# Patient Record
Sex: Female | Born: 1980 | Race: Black or African American | Hispanic: No | Marital: Married | Smoking: Current every day smoker
Health system: Southern US, Community
[De-identification: ages and names within clinical notes are randomized; demographics above are authoritative.]

## PROBLEM LIST (undated history)

## (undated) DIAGNOSIS — O24919 Unspecified diabetes mellitus in pregnancy, unspecified trimester: Secondary | ICD-10-CM

---
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
## 1999-02-04 ENCOUNTER — Inpatient Hospital Stay (HOSPITAL_COMMUNITY): Admission: AD | Admit: 1999-02-04 | Discharge: 1999-02-06 | Payer: Self-pay | Admitting: Obstetrics

## 1999-02-04 ENCOUNTER — Encounter (INDEPENDENT_AMBULATORY_CARE_PROVIDER_SITE_OTHER): Payer: Self-pay | Admitting: Specialist

## 1999-03-23 ENCOUNTER — Emergency Department (HOSPITAL_COMMUNITY): Admission: EM | Admit: 1999-03-23 | Discharge: 1999-03-23 | Payer: Self-pay | Admitting: Emergency Medicine

## 1999-03-25 ENCOUNTER — Emergency Department (HOSPITAL_COMMUNITY): Admission: EM | Admit: 1999-03-25 | Discharge: 1999-03-25 | Payer: Self-pay

## 2001-03-24 ENCOUNTER — Emergency Department (HOSPITAL_COMMUNITY): Admission: EM | Admit: 2001-03-24 | Discharge: 2001-03-24 | Payer: Self-pay | Admitting: Emergency Medicine

## 2001-08-29 ENCOUNTER — Emergency Department (HOSPITAL_COMMUNITY): Admission: EM | Admit: 2001-08-29 | Discharge: 2001-08-29 | Payer: Self-pay

## 2001-09-26 ENCOUNTER — Encounter: Payer: Self-pay | Admitting: Emergency Medicine

## 2001-09-26 ENCOUNTER — Emergency Department (HOSPITAL_COMMUNITY): Admission: EM | Admit: 2001-09-26 | Discharge: 2001-09-26 | Payer: Self-pay | Admitting: Emergency Medicine

## 2002-03-03 ENCOUNTER — Inpatient Hospital Stay (HOSPITAL_COMMUNITY): Admission: AD | Admit: 2002-03-03 | Discharge: 2002-03-03 | Payer: Self-pay | Admitting: Obstetrics and Gynecology

## 2003-08-15 ENCOUNTER — Inpatient Hospital Stay (HOSPITAL_COMMUNITY): Admission: AD | Admit: 2003-08-15 | Discharge: 2003-08-15 | Payer: Self-pay | Admitting: Obstetrics and Gynecology

## 2005-04-24 ENCOUNTER — Emergency Department (HOSPITAL_COMMUNITY): Admission: EM | Admit: 2005-04-24 | Discharge: 2005-04-24 | Payer: Self-pay | Admitting: Emergency Medicine

## 2009-02-14 ENCOUNTER — Ambulatory Visit (HOSPITAL_COMMUNITY): Admission: RE | Admit: 2009-02-14 | Discharge: 2009-02-14 | Payer: Self-pay | Admitting: Obstetrics & Gynecology

## 2009-03-06 ENCOUNTER — Encounter: Admission: RE | Admit: 2009-03-06 | Discharge: 2009-03-06 | Payer: Self-pay | Admitting: Gastroenterology

## 2010-05-12 ENCOUNTER — Ambulatory Visit (HOSPITAL_COMMUNITY): Admission: RE | Admit: 2010-05-12 | Discharge: 2010-05-12 | Payer: Self-pay | Admitting: Obstetrics & Gynecology

## 2010-06-09 ENCOUNTER — Inpatient Hospital Stay (HOSPITAL_COMMUNITY): Admission: AD | Admit: 2010-06-09 | Discharge: 2010-06-12 | Payer: Self-pay | Admitting: Obstetrics

## 2010-06-09 ENCOUNTER — Encounter: Payer: Self-pay | Admitting: Obstetrics & Gynecology

## 2010-06-11 ENCOUNTER — Ambulatory Visit (HOSPITAL_COMMUNITY): Admission: RE | Admit: 2010-06-11 | Discharge: 2010-06-11 | Payer: Self-pay | Admitting: Obstetrics

## 2010-08-02 ENCOUNTER — Encounter: Payer: Self-pay | Admitting: Obstetrics & Gynecology

## 2010-08-07 ENCOUNTER — Other Ambulatory Visit: Payer: Self-pay | Admitting: Obstetrics & Gynecology

## 2010-08-11 ENCOUNTER — Ambulatory Visit (HOSPITAL_COMMUNITY)
Admission: RE | Admit: 2010-08-11 | Discharge: 2010-08-11 | Payer: Self-pay | Source: Home / Self Care | Attending: Obstetrics | Admitting: Obstetrics

## 2010-08-11 ENCOUNTER — Other Ambulatory Visit (HOSPITAL_COMMUNITY): Payer: Self-pay | Admitting: Maternal and Fetal Medicine

## 2010-08-11 DIAGNOSIS — O24419 Gestational diabetes mellitus in pregnancy, unspecified control: Secondary | ICD-10-CM

## 2010-08-13 ENCOUNTER — Encounter: Payer: Self-pay | Admitting: Obstetrics

## 2010-08-14 ENCOUNTER — Ambulatory Visit (HOSPITAL_COMMUNITY): Payer: Self-pay

## 2010-08-14 ENCOUNTER — Ambulatory Visit (HOSPITAL_COMMUNITY)
Admission: RE | Admit: 2010-08-14 | Discharge: 2010-08-14 | Disposition: A | Discharge status: Home / Self Care | Payer: Medicaid Other | Source: Ambulatory Visit | Attending: Maternal and Fetal Medicine | Admitting: Maternal and Fetal Medicine

## 2010-08-14 ENCOUNTER — Encounter (HOSPITAL_COMMUNITY): Payer: Self-pay

## 2010-08-14 ENCOUNTER — Other Ambulatory Visit (HOSPITAL_COMMUNITY): Payer: Self-pay | Admitting: Maternal and Fetal Medicine

## 2010-08-14 DIAGNOSIS — O99810 Abnormal glucose complicating pregnancy: Secondary | ICD-10-CM | POA: Insufficient documentation

## 2010-08-14 DIAGNOSIS — O2690 Pregnancy related conditions, unspecified, unspecified trimester: Secondary | ICD-10-CM

## 2010-08-14 DIAGNOSIS — O24419 Gestational diabetes mellitus in pregnancy, unspecified control: Secondary | ICD-10-CM

## 2010-08-14 DIAGNOSIS — O26879 Cervical shortening, unspecified trimester: Secondary | ICD-10-CM | POA: Insufficient documentation

## 2010-08-14 DIAGNOSIS — Z8751 Personal history of pre-term labor: Secondary | ICD-10-CM | POA: Insufficient documentation

## 2010-08-14 HISTORY — DX: Unspecified diabetes mellitus in pregnancy, unspecified trimester: O24.919

## 2010-08-18 ENCOUNTER — Inpatient Hospital Stay (HOSPITAL_COMMUNITY)
Admission: AD | Admit: 2010-08-18 | Discharge: 2010-08-23 | DRG: 774 | Disposition: A | Discharge status: Home Health | Payer: Medicaid Other | Source: Ambulatory Visit | Attending: Obstetrics & Gynecology | Admitting: Obstetrics & Gynecology

## 2010-08-18 DIAGNOSIS — O2432 Unspecified pre-existing diabetes mellitus in childbirth: Secondary | ICD-10-CM | POA: Diagnosis present

## 2010-08-18 DIAGNOSIS — O328XX0 Maternal care for other malpresentation of fetus, not applicable or unspecified: Secondary | ICD-10-CM | POA: Diagnosis present

## 2010-08-18 DIAGNOSIS — O99892 Other specified diseases and conditions complicating childbirth: Secondary | ICD-10-CM | POA: Diagnosis present

## 2010-08-18 DIAGNOSIS — IMO0002 Reserved for concepts with insufficient information to code with codable children: Principal | ICD-10-CM | POA: Diagnosis present

## 2010-08-18 DIAGNOSIS — Z22330 Carrier of Group B streptococcus: Secondary | ICD-10-CM

## 2010-08-18 DIAGNOSIS — E119 Type 2 diabetes mellitus without complications: Secondary | ICD-10-CM | POA: Diagnosis present

## 2010-08-18 LAB — URINALYSIS, DIPSTICK ONLY
Specific Gravity, Urine: >1.03 — ABNORMAL HIGH (ref 1.005–1.030)
Urine Glucose, Fasting: NEGATIVE mg/dL
Urobilinogen, UA: 0.2 mg/dL (ref 0.0–1.0)
pH: 6 (ref 5.0–8.0)

## 2010-08-18 LAB — COMPREHENSIVE METABOLIC PANEL
Alkaline Phosphatase: 130 U/L — ABNORMAL HIGH (ref 39–117)
BUN: 7 mg/dL (ref 6–23)
Chloride: 109 mEq/L (ref 96–112)
GFR calc non Af Amer: >60 mL/min (ref >60)
Glucose, Bld: 80 mg/dL (ref 70–99)
Potassium: 4.3 mEq/L (ref 3.5–5.1)
Total Bilirubin: 0.5 mg/dL (ref 0.3–1.2)

## 2010-08-18 LAB — CBC
HCT: 34.5 % — ABNORMAL LOW (ref 36.0–46.0)
MCH: 28.9 pg (ref 26.0–34.0)
MCV: 86.7 fL (ref 78.0–100.0)
RBC: 3.98 MIL/uL (ref 3.87–5.11)
WBC: 8.5 10*3/uL (ref 4.0–10.5)

## 2010-08-18 LAB — URIC ACID: Uric Acid, Serum: 5.9 mg/dL (ref 2.4–7.0)

## 2010-08-18 LAB — GLUCOSE, CAPILLARY: Glucose-Capillary: 121 mg/dL — ABNORMAL HIGH (ref 70–99)

## 2010-08-18 LAB — LACTATE DEHYDROGENASE: LDH: 177 U/L (ref 94–250)

## 2010-08-19 LAB — PROTEIN, URINE, 24 HOUR
Protein, 24H Urine: 410 mg/d — ABNORMAL HIGH (ref 50–100)
Urine Total Volume-UPROT: 2925 mL

## 2010-08-20 ENCOUNTER — Ambulatory Visit (HOSPITAL_COMMUNITY): Payer: Medicaid Other

## 2010-08-20 LAB — CBC
HCT: 32.4 % — ABNORMAL LOW (ref 36.0–46.0)
Hemoglobin: 10.9 g/dL — ABNORMAL LOW (ref 12.0–15.0)
MCHC: 33.6 g/dL (ref 30.0–36.0)
Platelets: 243 10*3/uL (ref 150–400)
RDW: 14 % (ref 11.5–15.5)
WBC: 10.5 10*3/uL (ref 4.0–10.5)

## 2010-08-20 LAB — COMPREHENSIVE METABOLIC PANEL
ALT: 17 U/L (ref 0–35)
AST: 27 U/L (ref 0–37)
Albumin: 2.6 g/dL — ABNORMAL LOW (ref 3.5–5.2)
Alkaline Phosphatase: 126 U/L — ABNORMAL HIGH (ref 39–117)
BUN: 6 mg/dL (ref 6–23)
CO2: 19 mEq/L (ref 19–32)
Calcium: 8.1 mg/dL — ABNORMAL LOW (ref 8.4–10.5)
Chloride: 110 mEq/L (ref 96–112)
Creatinine, Ser: 0.7 mg/dL (ref 0.4–1.2)
GFR calc Af Amer: >60 mL/min (ref >60)
GFR calc non Af Amer: >60 mL/min (ref >60)
Glucose, Bld: 103 mg/dL — ABNORMAL HIGH (ref 70–99)
Potassium: 3.8 mEq/L (ref 3.5–5.1)
Sodium: 136 mEq/L (ref 135–145)
Total Bilirubin: 0.4 mg/dL (ref 0.3–1.2)
Total Protein: 5.5 g/dL — ABNORMAL LOW (ref 6.0–8.3)

## 2010-08-20 LAB — GLUCOSE, CAPILLARY
Glucose-Capillary: 131 mg/dL — ABNORMAL HIGH (ref 70–99)
Glucose-Capillary: 149 mg/dL — ABNORMAL HIGH (ref 70–99)
Glucose-Capillary: 99 mg/dL (ref 70–99)

## 2010-08-21 ENCOUNTER — Other Ambulatory Visit: Payer: Self-pay | Admitting: Obstetrics & Gynecology

## 2010-08-21 LAB — GLUCOSE, CAPILLARY
Glucose-Capillary: 96 mg/dL (ref 70–99)
Glucose-Capillary: 105 mg/dL — ABNORMAL HIGH (ref 70–99)
Glucose-Capillary: 99 mg/dL (ref 70–99)
Glucose-Capillary: 105 mg/dL — ABNORMAL HIGH (ref 70–99)
Glucose-Capillary: 117 mg/dL — ABNORMAL HIGH (ref 70–99)

## 2010-08-21 LAB — MRSA PCR SCREENING: MRSA by PCR: NEGATIVE

## 2010-08-22 LAB — COMPREHENSIVE METABOLIC PANEL
AST: 19 U/L (ref 0–37)
Albumin: 2 g/dL — ABNORMAL LOW (ref 3.5–5.2)
BUN: 10 mg/dL (ref 6–23)
Calcium: 7.6 mg/dL — ABNORMAL LOW (ref 8.4–10.5)
Creatinine, Ser: 0.99 mg/dL (ref 0.4–1.2)
GFR calc Af Amer: >60 mL/min (ref >60)
GFR calc non Af Amer: >60 mL/min (ref >60)

## 2010-08-22 LAB — T4, FREE: Free T4: 0.75 ng/dL — ABNORMAL LOW (ref 0.80–1.80)

## 2010-08-22 LAB — CARDIAC PANEL(CRET KIN+CKTOT+MB+TROPI)
CK, MB: 1.4 ng/mL (ref 0.3–4.0)
Total CK: 106 U/L (ref 7–177)
Troponin I: 0.02 ng/mL (ref 0.00–0.06)

## 2010-08-22 LAB — CBC
MCH: 29.2 pg (ref 26.0–34.0)
MCHC: 33.5 g/dL (ref 30.0–36.0)
Platelets: 200 10*3/uL (ref 150–400)

## 2010-08-24 NOTE — H&P (Signed)
Brenda Garcia, Brenda Garcia                ACCOUNT NO.:  [id number]  MEDICAL RECORD NO.:  [id number]           PATIENT TYPE:  I  LOCATION:  9174                          FACILITY:  [facility]  PHYSICIAN:  Roseanna Rainbow, M.D.DATE OF BIRTH:  03-Aug-1980  DATE OF ADMISSION:  08/18/2010 DATE OF DISCHARGE:                             HISTORY & PHYSICAL   CHIEF COMPLAINT:  The patient is a 30 year old para 1 with an estimated date of confinement by ultrasound of September 28, 2010 with an intrauterine pregnancy at 34.1 weeks who presents for a routine prenatal visit.  HISTORY OF PRESENT ILLNESS:  The patient complains of right upper quadrant, epigastric pain.  She denies any associated nausea or vomiting.  She also denies any neurological symptoms.  The patient's CBG record was reviewed.  The fasting CBGs were within range.  However, there were some outliers--postprandial CBG's in the 180-200 range. These abnormal CBGs were random and there was no pattern to the CBG elevations.  The patient reports only taking the glyburide 5 mg twice a day in the morning and at bedtime.  She reports good fetal movement.  ALLERGIES:  No known drug allergies.  MEDICATIONS:  Glyburide 5 mg p.o. b.i.d.  OB RISK FACTORS:  History of a previous preterm delivery.  The patient has been receiving 17-P injections likely pregestational adult-onset diabetes type 2 undiagnosed.  Hemoglobin A1c on February 2 was 7. History of genital herpes, cervical insufficiency, GBS asymptomatic bacteriuria.  PAST OB HISTORY:  In July 2000, she was delivered at 27 weeks vaginal delivery 2 pounds 6 ounces female.  PRENATAL LABS:  Chlamydia negative.  Urine culture and sensitivity insignificant growth on February 2.  On September 20, GBS was isolated. Pap smear negative.  GC probe negative.  On a 2-hour GTT, all of the values were abnormal with fasting of 130, 1-hour 211, 2-hour 195. Hepatitis B surface antigen negative.  Hematocrit 33.9,  hemoglobin 11.8, HIV nonreactive, platelets 315,000.  Blood type is O positive, RPR nonreactive, rubella immune.  Sickle cell negative.  An ultrasound on November 30 was a limited study for cervical length at 24 weeks 3 days. The cervical length was dynamic ranging from 0.8-1.2 cm with funneling. An ultrasound on November 28, no previa, normal amniotic fluid index. The estimated fetal weight was at the 75th percentile for a 24 weeks 1 day gestation.  Cervical length was 1.2 cm with a U-shaped funnel. Prior pyelectasis was noted and resolved.  At ultrasound on October 31 at 20 weeks 1 day, normal cervical length, normal level to fetal anatomy, very mild unilateral pyelectasis, normal amniotic fluid volume. An early ultrasound at 14 weeks 5 days, sonographic EDC of March 18.  PAST GYN HISTORY:  Please see the above.  PAST MEDICAL HISTORY:  There is a vague history of a cardiac arrhythmia.  PAST SURGICAL HISTORY:  She denies.  SOCIAL HISTORY:  She is a Consulting civil engineer, is engaged, living with a significant other, not currently using alcohol, formally a moderate user, has no significant smoking history.  FAMILY HISTORY:  Noncontributory.  REVIEW OF SYSTEMS:  NEUROLOGIC:  Please see the  above.  GI: Please see the above.  PHYSICAL EXAMINATION:  VITAL SIGNS:  Blood pressure 149/81, oxygen saturation 98% on room air.  Urinalysis 2+ protein on urine dip. GENERAL:  Moderate distress. ABDOMEN:  Nontender, gravid.  Sterile vaginal exam.  The cervix is 2 cm dilated, 60% effaced.  An informal ultrasound confirms a cephalic presentation.  ASSESSMENT:  Intrauterine pregnancy at 34.1 weeks complicated by 1. Likely pre gestational adult-onset diabetes type 2, borderline     control. 2. Now with rule out severe preeclampsia with epigastric pain,     proteinuria. 3.  Positive GBS.  history of GBS asymptomatic bacteria.  PLAN:  Admission, PIH panel, monitor closely.  Magnesium sulfate for seizure  prophylaxis.  This patient was reviewed with Dr. Rica Koyanagi of Maternal Fetal Medicine.  Recommend delivery for trending upward blood pressures, worsening epigastric pain, or trending abnormal laboratory values.  Penicillin for GBS prophylaxis in the likelihood that the patient undergoes induction of labor.     Roseanna Rainbow, M.D.     Judee Clara  D:  08/18/2010  T:  08/18/2010  Job:  [id number]  Electronically Signed by Antionette Char M.D. on 08/21/2010 11:49:01 AM

## 2010-09-04 ENCOUNTER — Ambulatory Visit (HOSPITAL_COMMUNITY): Payer: Medicaid Other

## 2010-09-23 LAB — URINALYSIS, ROUTINE W REFLEX MICROSCOPIC
Nitrite: NEGATIVE
Specific Gravity, Urine: 1.025 (ref 1.005–1.030)
Urobilinogen, UA: 0.2 mg/dL (ref 0.0–1.0)
pH: 6.5 (ref 5.0–8.0)

## 2010-09-23 LAB — CBC
Hemoglobin: 11.3 g/dL — ABNORMAL LOW (ref 12.0–15.0)
MCH: 31.4 pg (ref 26.0–34.0)
MCHC: 34.1 g/dL (ref 30.0–36.0)
MCV: 92 fL (ref 78.0–100.0)

## 2010-09-23 LAB — COMPREHENSIVE METABOLIC PANEL
AST: 23 U/L (ref 0–37)
BUN: 4 mg/dL — ABNORMAL LOW (ref 6–23)
CO2: 20 mEq/L (ref 19–32)
Calcium: 9 mg/dL (ref 8.4–10.5)
Chloride: 108 mEq/L (ref 96–112)
Creatinine, Ser: 0.49 mg/dL (ref 0.4–1.2)
GFR calc Af Amer: >60 mL/min (ref >60)
GFR calc non Af Amer: >60 mL/min (ref >60)
Glucose, Bld: 120 mg/dL — ABNORMAL HIGH (ref 70–99)
Total Bilirubin: 0.1 mg/dL — ABNORMAL LOW (ref 0.3–1.2)

## 2010-09-28 ENCOUNTER — Inpatient Hospital Stay (HOSPITAL_COMMUNITY): Admission: AD | Admit: 2010-09-28 | Payer: Self-pay | Source: Home / Self Care | Admitting: Obstetrics & Gynecology

## 2010-10-07 ENCOUNTER — Ambulatory Visit (HOSPITAL_COMMUNITY)
Admission: RE | Admit: 2010-10-07 | Payer: Medicaid Other | Source: Ambulatory Visit | Admitting: Obstetrics & Gynecology

## 2011-04-28 ENCOUNTER — Encounter (HOSPITAL_COMMUNITY): Payer: Self-pay | Admitting: *Deleted

## 2011-06-20 ENCOUNTER — Emergency Department (HOSPITAL_COMMUNITY)
Admission: EM | Admit: 2011-06-20 | Discharge: 2011-06-20 | Discharge status: Absent without leave | Payer: Self-pay | Source: Home / Self Care

## 2012-02-24 ENCOUNTER — Ambulatory Visit (INDEPENDENT_AMBULATORY_CARE_PROVIDER_SITE_OTHER): Payer: BC Managed Care – PPO | Admitting: Family Medicine

## 2012-02-24 VITALS — BP 118/74 | HR 79 | Temp 98.5°F | Resp 18 | Ht 68.0 in | Wt 237.0 lb

## 2012-02-24 DIAGNOSIS — Z23 Encounter for immunization: Secondary | ICD-10-CM

## 2012-02-24 NOTE — Progress Notes (Signed)
[facility] [address], [place] [address] [phone]  Date:  02/24/2012   Name:  Brenda Garcia   DOB:  December 16, 1980   MRN:  [id number]  PCP:  Per Patient No Pcp    Chief Complaint: Immunizations   History of Present Illness:  Brenda Garcia is a 31 y.o. very pleasant female patient who presents with the following:  She needs a tdap to start at [employer]- she plans to study biology.  Her last tetanus was about 10 years ago.  She is generally healthy, no chance of pregnancy.  No other concerns today  There is no problem list on file for this patient.   Past Medical History  Diagnosis Date  . Diabetes in pregnancy     No past surgical history on file.  History  Substance Use Topics  . Smoking status: Never Smoker   . Smokeless tobacco: Not on file  . Alcohol Use: Not on file    No family history on file.  Allergies  Allergen Reactions  . Iohexol      Code: VOM, Desc: severe vomiting tightness in face swollen face and eyes  fluids run no meds given pt observed 30 min and released with reation card  ms 03/06/2009     Medication list has been reviewed and updated.  No current outpatient prescriptions on file prior to visit.    Review of Systems:  As per HPI- otherwise negative.   Physical Examination: Filed Vitals:   02/24/12 1607  BP: 118/74  Pulse: 79  Temp: 98.5 F (36.9 C)  Resp: 18   Filed Vitals:   02/24/12 1607  Height: 5\' 8"  (1.727 m)  Weight: 237 lb (107.502 kg)   Body mass index is 36.04 kg/(m^2). Ideal Body Weight: Weight in (lb) to have BMI = 25: 164.1   GEN: WDWN, NAD, Non-toxic, A & O x 3, obese HEENT: Atraumatic, Normocephalic. Neck supple. No masses, No LAD.  Oropharynx wnl, PEERL Ears and Nose: No external deformity. CV: RRR, No M/G/R. No JVD. No thrill. No extra heart sounds. PULM: CTA B, no wheezes, crackles, rhonchi. No retractions. No resp. distress. No accessory muscle use. EXTR: No c/c/e NEURO  Normal gait.  PSYCH: Normally interactive. Conversant. Not depressed or anxious appearing.  Calm demeanor.    Assessment and Plan: 1. Need for diphtheria-tetanus-pertussis (Tdap) vaccine, adult/adolescent  Tdap vaccine greater than or equal to 7yo IM   Update Tdap today.  Best of luck in your studies!   Abbe Amsterdam, MD

## 2012-04-20 ENCOUNTER — Inpatient Hospital Stay (HOSPITAL_COMMUNITY)
Admission: EM | Admit: 2012-04-20 | Discharge: 2012-04-22 | DRG: 494 | Disposition: A | Discharge status: Home / Self Care | Payer: BC Managed Care – PPO | Attending: General Surgery | Admitting: General Surgery

## 2012-04-20 ENCOUNTER — Encounter (HOSPITAL_COMMUNITY): Payer: Self-pay | Admitting: *Deleted

## 2012-04-20 ENCOUNTER — Emergency Department (HOSPITAL_COMMUNITY): Payer: BC Managed Care – PPO

## 2012-04-20 DIAGNOSIS — F17200 Nicotine dependence, unspecified, uncomplicated: Secondary | ICD-10-CM | POA: Diagnosis present

## 2012-04-20 DIAGNOSIS — K8010 Calculus of gallbladder with chronic cholecystitis without obstruction: Secondary | ICD-10-CM

## 2012-04-20 DIAGNOSIS — K8020 Calculus of gallbladder without cholecystitis without obstruction: Principal | ICD-10-CM | POA: Diagnosis present

## 2012-04-20 LAB — COMPREHENSIVE METABOLIC PANEL
ALT: 16 U/L (ref 0–35)
AST: 17 U/L (ref 0–37)
Albumin: 4.2 g/dL (ref 3.5–5.2)
Alkaline Phosphatase: 65 U/L (ref 39–117)
Calcium: 9.6 mg/dL (ref 8.4–10.5)
GFR calc Af Amer: >90 mL/min (ref >90)
Glucose, Bld: 176 mg/dL — ABNORMAL HIGH (ref 70–99)
Potassium: 4 mEq/L (ref 3.5–5.1)
Sodium: 138 mEq/L (ref 135–145)
Total Protein: 8 g/dL (ref 6.0–8.3)

## 2012-04-20 LAB — CBC WITH DIFFERENTIAL/PLATELET
Basophils Absolute: 0.1 10*3/uL (ref 0.0–0.1)
Eosinophils Absolute: 0 10*3/uL (ref 0.0–0.7)
Lymphs Abs: 2 10*3/uL (ref 0.7–4.0)
MCH: 30.3 pg (ref 26.0–34.0)
Neutrophils Relative %: 74 % (ref 43–77)
Platelets: 324 10*3/uL (ref 150–400)
RBC: 4.58 MIL/uL (ref 3.87–5.11)
RDW: 12.1 % (ref 11.5–15.5)
WBC: 9.8 10*3/uL (ref 4.0–10.5)

## 2012-04-20 LAB — URINE MICROSCOPIC-ADD ON

## 2012-04-20 LAB — PREGNANCY, URINE: Preg Test, Ur: NEGATIVE

## 2012-04-20 LAB — URINALYSIS, ROUTINE W REFLEX MICROSCOPIC
Hgb urine dipstick: NEGATIVE
Nitrite: NEGATIVE
Protein, ur: 30 mg/dL — AB
Specific Gravity, Urine: 1.034 — ABNORMAL HIGH (ref 1.005–1.030)
Urobilinogen, UA: 0.2 mg/dL (ref 0.0–1.0)

## 2012-04-20 MED ORDER — SODIUM CHLORIDE 0.9 % IV BOLUS (SEPSIS)
1000.0000 mL | Freq: Once | INTRAVENOUS | Status: AC
  Administered 2012-04-20: 1000 mL via INTRAVENOUS

## 2012-04-20 MED ORDER — DIPHENHYDRAMINE HCL 50 MG/ML IJ SOLN: 12.5000 mg | Freq: Four times a day (QID) | INTRAMUSCULAR | Status: DC | PRN

## 2012-04-20 MED ORDER — KCL IN DEXTROSE-NACL 20-5-0.45 MEQ/L-%-% IV SOLN
INTRAVENOUS | Status: DC
  Administered 2012-04-20: 16:00:00 via INTRAVENOUS
  Administered 2012-04-21: 100 mL via INTRAVENOUS
  Filled 2012-04-20 (×4): qty 1000

## 2012-04-20 MED ORDER — HYDROMORPHONE HCL PF 1 MG/ML IJ SOLN
0.5000 mg | Freq: Once | INTRAMUSCULAR | Status: AC
  Administered 2012-04-20: 0.5 mg via INTRAVENOUS
  Filled 2012-04-20: qty 1

## 2012-04-20 MED ORDER — DIPHENHYDRAMINE HCL 12.5 MG/5ML PO ELIX
12.5000 mg | ORAL_SOLUTION | Freq: Four times a day (QID) | ORAL | Status: DC | PRN
  Filled 2012-04-20: qty 10

## 2012-04-20 MED ORDER — ONDANSETRON HCL 4 MG/2ML IJ SOLN
4.0000 mg | Freq: Once | INTRAMUSCULAR | Status: AC
  Administered 2012-04-20: 4 mg via INTRAVENOUS
  Filled 2012-04-20: qty 2

## 2012-04-20 MED ORDER — ONDANSETRON HCL 4 MG/2ML IJ SOLN: 4.0000 mg | Freq: Four times a day (QID) | INTRAMUSCULAR | Status: DC | PRN

## 2012-04-20 MED ORDER — HYDROMORPHONE HCL PF 1 MG/ML IJ SOLN
0.5000 mg | INTRAMUSCULAR | Status: DC | PRN
  Administered 2012-04-20 – 2012-04-21 (×5): 1 mg via INTRAVENOUS
  Filled 2012-04-20 (×5): qty 1

## 2012-04-20 MED ORDER — HYDROMORPHONE HCL PF 1 MG/ML IJ SOLN
1.0000 mg | Freq: Once | INTRAMUSCULAR | Status: AC
  Administered 2012-04-20: 1 mg via INTRAVENOUS
  Filled 2012-04-20: qty 1

## 2012-04-20 NOTE — ED Notes (Signed)
Patient transported to Ultrasound 

## 2012-04-20 NOTE — ED Notes (Signed)
C/o RUQ pain, n/v onset this morning. Denies diarrhea

## 2012-04-20 NOTE — ED Notes (Signed)
C/o intermittent RUQ pain x 3-4 yrs. Reports pain seems to be worse after eating. This episode started this morning. +n/v, denies diarrhea, fever, urinary frequency, dysuria. Stated, "my stomach has been acting up & beginning to hurt the past month".

## 2012-04-20 NOTE — ED Notes (Addendum)
Patient transported to Ultrasound 

## 2012-04-20 NOTE — H&P (Signed)
Brenda Garcia is an 31 y.o. female.   Chief Complaint: Abdominal pain, nausea and vomiting HPI: 31 yr old female who presented to [facility] with 24 hour history of abd pain, nausea and vomiting.  She reports having similar symptoms for 2 years but none as severe and this is the first time she has had vomiting.  Her previous symptoms would last about 2-3 days then improve.  She could usually still eat with these symptoms.  She is having pain in the RUQ.  She denies fever, chills, diarrhea, constipation, vomiting blood or blood in stool.  Her pain and nausea is not getting any better.  She has not had any abdominal surgeries in the past.  History reviewed. No pertinent past medical history.  History reviewed. No pertinent past surgical history.  No family history on file. Social History:  reports that she has been smoking.  She does not have any smokeless tobacco history on file. She reports that she drinks alcohol. She reports that she does not use illicit drugs.  Allergies:  Allergies  Allergen Reactions  . Iohexol      Code: VOM, Desc: severe vomiting tightness in face swollen face and eyes  fluids run no meds given pt observed 30 min and released with reation card  ms 03/06/2009      (Not in a hospital admission)  Results for orders placed during the hospital encounter of 04/20/12 (from the past 48 hour(s))  URINALYSIS, ROUTINE W REFLEX MICROSCOPIC     Status: Abnormal   Collection Time   04/20/12 10:02 AM      Component Value Range Comment   Color, Urine YELLOW  YELLOW    APPearance CLEAR  CLEAR    Specific Gravity, Urine 1.034 (*) 1.005 - 1.030    pH 8.0  5.0 - 8.0    Glucose, UA NEGATIVE  NEGATIVE mg/dL    Hgb urine dipstick NEGATIVE  NEGATIVE    Bilirubin Urine NEGATIVE  NEGATIVE    Ketones, ur NEGATIVE  NEGATIVE mg/dL    Protein, ur 30 (*) NEGATIVE mg/dL    Urobilinogen, UA 0.2  0.0 - 1.0 mg/dL    Nitrite NEGATIVE  NEGATIVE    Leukocytes, UA TRACE (*) NEGATIVE   PREGNANCY,  URINE     Status: Normal   Collection Time   04/20/12 10:02 AM      Component Value Range Comment   Preg Test, Ur NEGATIVE  NEGATIVE   URINE MICROSCOPIC-ADD ON     Status: Normal   Collection Time   04/20/12 10:02 AM      Component Value Range Comment   Squamous Epithelial / LPF RARE  RARE    WBC, UA 0-2  <3 WBC/hpf    RBC / HPF 0-2  <3 RBC/hpf    Bacteria, UA RARE  RARE   CBC WITH DIFFERENTIAL     Status: Normal   Collection Time   04/20/12 10:30 AM      Component Value Range Comment   WBC 9.8  4.0 - 10.5 K/uL    RBC 4.58  3.87 - 5.11 MIL/uL    Hemoglobin 13.9  12.0 - 15.0 g/dL    HCT 47.8  29.5 - 62.1 %    MCV 87.3  78.0 - 100.0 fL    MCH 30.3  26.0 - 34.0 pg    MCHC 34.8  30.0 - 36.0 g/dL    RDW 30.8  65.7 - 84.6 %    Platelets 324  150 -  400 K/uL    Neutrophils Relative 74  43 - 77 %    Neutro Abs 7.3  1.7 - 7.7 K/uL    Lymphocytes Relative 20  12 - 46 %    Lymphs Abs 2.0  0.7 - 4.0 K/uL    Monocytes Relative 5  3 - 12 %    Monocytes Absolute 0.5  0.1 - 1.0 K/uL    Eosinophils Relative 0  0 - 5 %    Eosinophils Absolute 0.0  0.0 - 0.7 K/uL    Basophils Relative 1  0 - 1 %    Basophils Absolute 0.1  0.0 - 0.1 K/uL   COMPREHENSIVE METABOLIC PANEL     Status: Abnormal   Collection Time   04/20/12 10:30 AM      Component Value Range Comment   Sodium 138  135 - 145 mEq/L    Potassium 4.0  3.5 - 5.1 mEq/L    Chloride 104  96 - 112 mEq/L    CO2 23  19 - 32 mEq/L    Glucose, Bld 176 (*) 70 - 99 mg/dL    BUN 8  6 - 23 mg/dL    Creatinine, Ser 4.09  0.50 - 1.10 mg/dL    Calcium 9.6  8.4 - 81.1 mg/dL    Total Protein 8.0  6.0 - 8.3 g/dL    Albumin 4.2  3.5 - 5.2 g/dL    AST 17  0 - 37 U/L    ALT 16  0 - 35 U/L    Alkaline Phosphatase 65  39 - 117 U/L    Total Bilirubin 0.5  0.3 - 1.2 mg/dL    GFR calc non Af Amer >90  >90 mL/min    GFR calc Af Amer >90  >90 mL/min   LIPASE, BLOOD     Status: Normal   Collection Time   04/20/12 10:30 AM      Component Value Range Comment    Lipase 18  11 - 59 U/L    US Abdomen Complete  04/20/2012  *RADIOLOGY REPORT*  Clinical Data:  Abdominal pain with nausea and vomiting.  COMPLETE ABDOMINAL ULTRASOUND  Comparison:  CT abdomen pelvis 03/06/2009.  Findings:  Gallbladder:  Shadowing echogenic stones measure up to 1.8 cm, located in the neck of the gallbladder.  Nonshadowing echogenic sludge is seen as well.  Gallbladder wall measures 2 mm. Sonographic Murphy's sign is reportedly present.  Common bile duct:  Measures 5 mm, within normal limits.  Liver:  No focal lesion identified.  Within normal limits in parenchymal echogenicity.  IVC:  Appears normal.  Pancreas:  No focal abnormality seen.  Spleen:  Measures 8.4 cm, negative.  Right Kidney:  Measures 14.0 cm.  Parenchymal echogenicity is normal.  No hydronephrosis.  No focal lesions.  Left Kidney:  Measures 15.1 cm.  Parenchymal echogenicity is normal.  No hydronephrosis.  No focal lesions.  Abdominal aorta:  No aneurysm identified.  IMPRESSION: Gallstones and sludge with a stone in the gallbladder neck.  No associated wall thickening or pericholecystic fluid.  Positive sonographic Murphy's sign.  Please correlate clinically for acute cholecystitis.   Original Report Authenticated By: Reyes Ivan, M.D.     Review of Systems  Constitutional: Negative.   HENT: Negative.   Eyes: Negative.   Respiratory: Negative.   Cardiovascular: Negative.   Gastrointestinal: Positive for nausea, vomiting and abdominal pain.  Genitourinary: Negative.   Musculoskeletal: Negative.   Skin: Negative.   Neurological: Negative.  Endo/Heme/Allergies: Negative.   Psychiatric/Behavioral: Negative.     Blood pressure 119/59, pulse 54, temperature 98.4 F (36.9 C), temperature source Oral, resp. rate 13, height 5\' 9"  (1.753 m), weight 220 lb (99.791 kg), SpO2 99.00%. Physical Exam  Constitutional: She is oriented to person, place, and time. She appears well-developed and well-nourished. No  distress.  HENT:  Head: Normocephalic and atraumatic.  Eyes: Conjunctivae normal are normal. Pupils are equal, round, and reactive to light.  Neck: Normal range of motion. Neck supple.  Cardiovascular: Normal rate and regular rhythm.   Respiratory: Effort normal and breath sounds normal.  GI: Soft. Bowel sounds are normal. She exhibits no distension. There is tenderness. There is no guarding.  Genitourinary:       Deferred   Musculoskeletal: Normal range of motion.  Neurological: She is alert and oriented to person, place, and time.  Skin: Skin is warm and dry.  Psychiatric: She has a normal mood and affect. Her behavior is normal.     Assessment/Plan 1.  Bilary colic/cholelithiasis/gallstone in neck of gallbladder: the patient sounds as if she as had recurring bilary colic over the last 2 years, the patient's current symptoms are not improving therefore we will admit.  She does not appear to have a CBD obstruction.  We will place her on IV fluids, pain meds and antiemetics.  She is agreeable to laparoscopic cholecystectomy since she is having long term problems.  Dr Maisie Fus will see and evaluate the patient later today.  Silverio Hagan 04/20/2012, 2:29 PM

## 2012-04-20 NOTE — ED Provider Notes (Signed)
Medical screening examination/treatment/procedure(s) were performed by non-physician practitioner and as supervising physician I was immediately available for consultation/collaboration.  Flint Melter, MD 04/20/12 2005

## 2012-04-20 NOTE — ED Provider Notes (Signed)
Brenda Garcia is a 31 y.o. female who is here for evaluation of recurrent right upper abdominal pain. In the ED, she has been medicated twice for pain, and continues to have 8/10, right upper quadrant, pain. The pain is constant and unremitting. Evaluation is consistent with acute gallbladder disease, without cholecystitis. I have discussed case with the general surgery service and they will see and evaluate the patient. In the emergency department.   Medical screening examination/treatment/procedure(s) were conducted as a shared visit with non-physician practitioner(s) and myself.  I personally evaluated the patient during the encounter  Flint Melter, MD 04/20/12 2003

## 2012-04-20 NOTE — ED Provider Notes (Signed)
History     CSN: [id number]  Arrival date & time 04/20/12  9147   First MD Initiated Contact with Patient 04/20/12 1009      Chief Complaint  Patient presents with  . Abdominal Pain    (Consider location/radiation/quality/duration/timing/severity/associated sxs/prior treatment) Patient is a 31 y.o. female presenting with abdominal pain. The history is provided by the patient. No language interpreter was used.  Abdominal Pain The primary symptoms of the illness include abdominal pain, nausea and vomiting. The current episode started 3 to 5 hours ago. The onset of the illness was sudden. The problem has not changed since onset. The illness is associated with eating. The patient states that she believes she is currently not pregnant. The patient has not had a change in bowel habit. Symptoms associated with the illness do not include chills, anorexia, diaphoresis, heartburn, constipation, urgency, hematuria, frequency or back pain. Significant associated medical issues do not include PUD, GERD, inflammatory bowel disease, diabetes, sickle cell disease, gallstones, liver disease, substance abuse, diverticulitis, HIV or cardiac disease.    History reviewed. No pertinent past medical history.  History reviewed. No pertinent past surgical history.  No family history on file.  History  Substance Use Topics  . Smoking status: Current Every Day Smoker  . Smokeless tobacco: Not on file  . Alcohol Use: Yes    OB History    Grav Para Term Preterm Abortions TAB SAB Ect Mult Living   1 1  1             Review of Systems  Constitutional: Negative for chills and diaphoresis.  Gastrointestinal: Positive for nausea, vomiting and abdominal pain. Negative for heartburn, constipation and anorexia.  Genitourinary: Negative for urgency, frequency and hematuria.  Musculoskeletal: Negative for back pain.  All other systems reviewed and are negative.    Allergies  Iohexol  Home Medications    Current Outpatient Rx  Name Route Sig Dispense Refill  . MOTRIN PM PO Oral Take 1 tablet by mouth at bedtime as needed. For sleep      BP 107/39  Pulse 56  Temp 98.4 F (36.9 C) (Oral)  Resp 17  Ht 5\' 9"  (1.753 m)  Wt 220 lb (99.791 kg)  BMI 32.49 kg/m2  SpO2 100%  Physical Exam  Nursing note and vitals reviewed. Constitutional: She is oriented to person, place, and time. She appears well-developed and well-nourished.  HENT:  Head: Normocephalic and atraumatic.  Eyes: Conjunctivae normal and EOM are normal. Pupils are equal, round, and reactive to light.  Neck: Normal range of motion. Neck supple.  Cardiovascular: Normal rate, regular rhythm and normal heart sounds.   Pulmonary/Chest: Effort normal and breath sounds normal.  Abdominal: Soft. Bowel sounds are normal. She exhibits no distension and no mass. There is tenderness. There is no rebound and no guarding.       Severe RUQ tenderness to palpation  Musculoskeletal: Normal range of motion.  Neurological: She is alert and oriented to person, place, and time.  Skin: Skin is warm and dry.  Psychiatric: She has a normal mood and affect. Her behavior is normal. Judgment and thought content normal.    ED Course  Procedures (including critical care time)  Labs Reviewed  URINALYSIS, ROUTINE W REFLEX MICROSCOPIC - Abnormal; Notable for the following:    Specific Gravity, Urine 1.034 (*)     Protein, ur 30 (*)     Leukocytes, UA TRACE (*)     All other components within normal limits  COMPREHENSIVE METABOLIC PANEL - Abnormal; Notable for the following:    Glucose, Bld 176 (*)     All other components within normal limits  PREGNANCY, URINE  CBC WITH DIFFERENTIAL  LIPASE, BLOOD  URINE MICROSCOPIC-ADD ON   Results for orders placed during the hospital encounter of 04/20/12  URINALYSIS, ROUTINE W REFLEX MICROSCOPIC      Component Value Range   Color, Urine YELLOW  YELLOW   APPearance CLEAR  CLEAR   Specific Gravity,  Urine 1.034 (*) 1.005 - 1.030   pH 8.0  5.0 - 8.0   Glucose, UA NEGATIVE  NEGATIVE mg/dL   Hgb urine dipstick NEGATIVE  NEGATIVE   Bilirubin Urine NEGATIVE  NEGATIVE   Ketones, ur NEGATIVE  NEGATIVE mg/dL   Protein, ur 30 (*) NEGATIVE mg/dL   Urobilinogen, UA 0.2  0.0 - 1.0 mg/dL   Nitrite NEGATIVE  NEGATIVE   Leukocytes, UA TRACE (*) NEGATIVE  PREGNANCY, URINE      Component Value Range   Preg Test, Ur NEGATIVE  NEGATIVE  CBC WITH DIFFERENTIAL      Component Value Range   WBC 9.8  4.0 - 10.5 K/uL   RBC 4.58  3.87 - 5.11 MIL/uL   Hemoglobin 13.9  12.0 - 15.0 g/dL   HCT 16.1  09.6 - 04.5 %   MCV 87.3  78.0 - 100.0 fL   MCH 30.3  26.0 - 34.0 pg   MCHC 34.8  30.0 - 36.0 g/dL   RDW 40.9  81.1 - 91.4 %   Platelets 324  150 - 400 K/uL   Neutrophils Relative 74  43 - 77 %   Neutro Abs 7.3  1.7 - 7.7 K/uL   Lymphocytes Relative 20  12 - 46 %   Lymphs Abs 2.0  0.7 - 4.0 K/uL   Monocytes Relative 5  3 - 12 %   Monocytes Absolute 0.5  0.1 - 1.0 K/uL   Eosinophils Relative 0  0 - 5 %   Eosinophils Absolute 0.0  0.0 - 0.7 K/uL   Basophils Relative 1  0 - 1 %   Basophils Absolute 0.1  0.0 - 0.1 K/uL  COMPREHENSIVE METABOLIC PANEL      Component Value Range   Sodium 138  135 - 145 mEq/L   Potassium 4.0  3.5 - 5.1 mEq/L   Chloride 104  96 - 112 mEq/L   CO2 23  19 - 32 mEq/L   Glucose, Bld 176 (*) 70 - 99 mg/dL   BUN 8  6 - 23 mg/dL   Creatinine, Ser 7.82  0.50 - 1.10 mg/dL   Calcium 9.6  8.4 - 95.6 mg/dL   Total Protein 8.0  6.0 - 8.3 g/dL   Albumin 4.2  3.5 - 5.2 g/dL   AST 17  0 - 37 U/L   ALT 16  0 - 35 U/L   Alkaline Phosphatase 65  39 - 117 U/L   Total Bilirubin 0.5  0.3 - 1.2 mg/dL   GFR calc non Af Amer >90  >90 mL/min   GFR calc Af Amer >90  >90 mL/min  LIPASE, BLOOD      Component Value Range   Lipase 18  11 - 59 U/L  URINE MICROSCOPIC-ADD ON      Component Value Range   Squamous Epithelial / LPF RARE  RARE   WBC, UA 0-2  <3 WBC/hpf   RBC / HPF 0-2  <3 RBC/hpf    Bacteria, UA RARE  RARE   US Abdomen Complete  04/20/2012  *RADIOLOGY REPORT*  Clinical Data:  Abdominal pain with nausea and vomiting.  COMPLETE ABDOMINAL ULTRASOUND  Comparison:  CT abdomen pelvis 03/06/2009.  Findings:  Gallbladder:  Shadowing echogenic stones measure up to 1.8 cm, located in the neck of the gallbladder.  Nonshadowing echogenic sludge is seen as well.  Gallbladder wall measures 2 mm. Sonographic Murphy's sign is reportedly present.  Common bile duct:  Measures 5 mm, within normal limits.  Liver:  No focal lesion identified.  Within normal limits in parenchymal echogenicity.  IVC:  Appears normal.  Pancreas:  No focal abnormality seen.  Spleen:  Measures 8.4 cm, negative.  Right Kidney:  Measures 14.0 cm.  Parenchymal echogenicity is normal.  No hydronephrosis.  No focal lesions.  Left Kidney:  Measures 15.1 cm.  Parenchymal echogenicity is normal.  No hydronephrosis.  No focal lesions.  Abdominal aorta:  No aneurysm identified.  IMPRESSION: Gallstones and sludge with a stone in the gallbladder neck.  No associated wall thickening or pericholecystic fluid.  Positive sonographic Murphy's sign.  Please correlate clinically for acute cholecystitis.   Original Report Authenticated By: Reyes Ivan, M.D.       No diagnosis found.    MDM   31 year old female with abdominal pain.  Suspect gallbladder disease.  Abdominal US results detailed above.  This patient has been seen by and discussed with Dr. Effie Shy.  Dr. Effie Shy has consulted surgery.  Disposition: Admit.       Roxy Horseman, PA-C 04/20/12 1436

## 2012-04-20 NOTE — H&P (Signed)
ATTENDING ADDENDUM:  I personally reviewed patient's record, examined the patient, and formulated the following plan:  Agree with PA note.  OR tom.  NPO p MN.

## 2012-04-21 ENCOUNTER — Encounter (HOSPITAL_COMMUNITY): Admission: EM | Disposition: A | Discharge status: Home / Self Care | Payer: Self-pay | Source: Home / Self Care

## 2012-04-21 ENCOUNTER — Encounter (HOSPITAL_COMMUNITY): Payer: Self-pay | Admitting: Anesthesiology

## 2012-04-21 ENCOUNTER — Inpatient Hospital Stay (HOSPITAL_COMMUNITY): Payer: BC Managed Care – PPO | Admitting: Anesthesiology

## 2012-04-21 HISTORY — PX: CHOLECYSTECTOMY: SHX55

## 2012-04-21 LAB — HEPATIC FUNCTION PANEL
ALT: 12 U/L (ref 0–35)
AST: 15 U/L (ref 0–37)
Albumin: 3.2 g/dL — ABNORMAL LOW (ref 3.5–5.2)
Alkaline Phosphatase: 59 U/L (ref 39–117)
Bilirubin, Direct: 0.1 mg/dL (ref 0.0–0.3)
Indirect Bilirubin: 0.9 mg/dL (ref 0.3–0.9)
Total Bilirubin: 1 mg/dL (ref 0.3–1.2)
Total Protein: 6.3 g/dL (ref 6.0–8.3)

## 2012-04-21 LAB — SURGICAL PCR SCREEN
MRSA, PCR: NEGATIVE
Staphylococcus aureus: NEGATIVE

## 2012-04-21 SURGERY — LAPAROSCOPIC CHOLECYSTECTOMY
Anesthesia: General | Site: Abdomen | Wound class: Clean Contaminated

## 2012-04-21 MED ORDER — PROPOFOL 10 MG/ML IV BOLUS
INTRAVENOUS | Status: DC | PRN
  Administered 2012-04-21: 150 mg via INTRAVENOUS

## 2012-04-21 MED ORDER — LIDOCAINE HCL (CARDIAC) 20 MG/ML IV SOLN
INTRAVENOUS | Status: DC | PRN
  Administered 2012-04-21: 50 mg via INTRAVENOUS

## 2012-04-21 MED ORDER — SODIUM CHLORIDE 0.9 % IR SOLN
Status: DC | PRN
  Administered 2012-04-21: 1000 mL

## 2012-04-21 MED ORDER — ONDANSETRON HCL 4 MG/2ML IJ SOLN
INTRAMUSCULAR | Status: DC | PRN
  Administered 2012-04-21: 4 mg via INTRAVENOUS

## 2012-04-21 MED ORDER — NEOSTIGMINE METHYLSULFATE 1 MG/ML IJ SOLN
INTRAMUSCULAR | Status: DC | PRN
  Administered 2012-04-21: 4 mg via INTRAVENOUS

## 2012-04-21 MED ORDER — MIDAZOLAM HCL 5 MG/5ML IJ SOLN
INTRAMUSCULAR | Status: DC | PRN
  Administered 2012-04-21: 2 mg via INTRAVENOUS

## 2012-04-21 MED ORDER — 0.9 % SODIUM CHLORIDE (POUR BTL) OPTIME
TOPICAL | Status: DC | PRN
  Administered 2012-04-21: 1000 mL

## 2012-04-21 MED ORDER — BUPIVACAINE-EPINEPHRINE 0.25% -1:200000 IJ SOLN
INTRAMUSCULAR | Status: DC | PRN
  Administered 2012-04-21: 14 mL

## 2012-04-21 MED ORDER — DEXAMETHASONE SODIUM PHOSPHATE 4 MG/ML IJ SOLN
INTRAMUSCULAR | Status: DC | PRN
  Administered 2012-04-21: 4 mg via INTRAVENOUS

## 2012-04-21 MED ORDER — BUPIVACAINE-EPINEPHRINE PF 0.25-1:200000 % IJ SOLN
INTRAMUSCULAR | Status: AC
  Filled 2012-04-21: qty 30

## 2012-04-21 MED ORDER — HYDROMORPHONE HCL PF 1 MG/ML IJ SOLN
0.2500 mg | INTRAMUSCULAR | Status: DC | PRN
  Administered 2012-04-21 (×2): 0.5 mg via INTRAVENOUS

## 2012-04-21 MED ORDER — OXYCODONE HCL 5 MG PO TABS: 5.0000 mg | ORAL_TABLET | Freq: Once | ORAL | Status: DC | PRN

## 2012-04-21 MED ORDER — DEXTROSE 5 % IV SOLN
2.0000 g | INTRAVENOUS | Status: AC
  Administered 2012-04-21: 2 g via INTRAVENOUS
  Filled 2012-04-21: qty 2

## 2012-04-21 MED ORDER — OXYCODONE HCL 5 MG/5ML PO SOLN: 5.0000 mg | Freq: Once | ORAL | Status: DC | PRN

## 2012-04-21 MED ORDER — ROCURONIUM BROMIDE 100 MG/10ML IV SOLN
INTRAVENOUS | Status: DC | PRN
  Administered 2012-04-21: 5 mg via INTRAVENOUS
  Administered 2012-04-21: 40 mg via INTRAVENOUS

## 2012-04-21 MED ORDER — KCL IN DEXTROSE-NACL 20-5-0.45 MEQ/L-%-% IV SOLN
INTRAVENOUS | Status: DC
  Administered 2012-04-21: 15:00:00 via INTRAVENOUS
  Filled 2012-04-21 (×3): qty 1000

## 2012-04-21 MED ORDER — FENTANYL CITRATE 0.05 MG/ML IJ SOLN
INTRAMUSCULAR | Status: DC | PRN
  Administered 2012-04-21: 100 ug via INTRAVENOUS
  Administered 2012-04-21: 50 ug via INTRAVENOUS
  Administered 2012-04-21: 100 ug via INTRAVENOUS
  Administered 2012-04-21: 50 ug via INTRAVENOUS
  Administered 2012-04-21: 100 ug via INTRAVENOUS

## 2012-04-21 MED ORDER — HYDROMORPHONE HCL PF 1 MG/ML IJ SOLN
INTRAMUSCULAR | Status: AC
  Filled 2012-04-21: qty 1

## 2012-04-21 MED ORDER — LACTATED RINGERS IV SOLN
INTRAVENOUS | Status: DC | PRN
  Administered 2012-04-21 (×2): via INTRAVENOUS

## 2012-04-21 MED ORDER — LACTATED RINGERS IV SOLN
INTRAVENOUS | Status: DC
  Administered 2012-04-21: 10:00:00 via INTRAVENOUS

## 2012-04-21 MED ORDER — GLYCOPYRROLATE 0.2 MG/ML IJ SOLN
INTRAMUSCULAR | Status: DC | PRN
  Administered 2012-04-21: 0.6 mg via INTRAVENOUS

## 2012-04-21 SURGICAL SUPPLY — 46 items
ADH SKN CLS APL DERMABOND .7 (GAUZE/BANDAGES/DRESSINGS) ×2
APPLIER CLIP 5 13 M/L LIGAMAX5 (MISCELLANEOUS) ×3
APR CLP MED LRG 5 ANG JAW (MISCELLANEOUS) ×2
BAG SPEC RTRVL LRG 6X4 10 (ENDOMECHANICALS) ×2
CANISTER SUCTION 2500CC (MISCELLANEOUS) ×3 IMPLANT
CHLORAPREP W/TINT 26ML (MISCELLANEOUS) ×3 IMPLANT
CLIP APPLIE 5 13 M/L LIGAMAX5 (MISCELLANEOUS) ×1 IMPLANT
CLOTH BEACON ORANGE TIMEOUT ST (SAFETY) ×3 IMPLANT
COVER MAYO STAND STRL (DRAPES) ×3 IMPLANT
COVER SURGICAL LIGHT HANDLE (MISCELLANEOUS) ×3 IMPLANT
DECANTER SPIKE VIAL GLASS SM (MISCELLANEOUS) ×6 IMPLANT
DERMABOND ADVANCED (GAUZE/BANDAGES/DRESSINGS) ×1
DERMABOND ADVANCED .7 DNX12 (GAUZE/BANDAGES/DRESSINGS) ×1 IMPLANT
DRAPE C-ARM 42X72 X-RAY (DRAPES) ×3 IMPLANT
DRAPE UTILITY 15X26 W/TAPE STR (DRAPE) ×6 IMPLANT
ELECT REM PT RETURN 9FT ADLT (ELECTROSURGICAL) ×3
ELECTRODE REM PT RTRN 9FT ADLT (ELECTROSURGICAL) ×2 IMPLANT
GLOVE BIO SURGEON STRL SZ 6.5 (GLOVE) ×3 IMPLANT
GLOVE BIO SURGEON STRL SZ7.5 (GLOVE) ×4 IMPLANT
GLOVE BIO SURGEON STRL SZ8 (GLOVE) ×2 IMPLANT
GLOVE BIOGEL PI IND STRL 7.0 (GLOVE) ×2 IMPLANT
GLOVE BIOGEL PI IND STRL 7.5 (GLOVE) ×2 IMPLANT
GLOVE BIOGEL PI IND STRL 8 (GLOVE) ×1 IMPLANT
GLOVE BIOGEL PI INDICATOR 7.0 (GLOVE) ×1
GLOVE BIOGEL PI INDICATOR 7.5 (GLOVE) ×2
GLOVE BIOGEL PI INDICATOR 8 (GLOVE) ×1
GLOVE SURG SS PI 7.5 STRL IVOR (GLOVE) ×2 IMPLANT
GOWN PREVENTION PLUS XXLARGE (GOWN DISPOSABLE) ×3 IMPLANT
GOWN STRL NON-REIN LRG LVL3 (GOWN DISPOSABLE) ×9 IMPLANT
KIT BASIN OR (CUSTOM PROCEDURE TRAY) ×3 IMPLANT
KIT ROOM TURNOVER OR (KITS) ×3 IMPLANT
NS IRRIG 1000ML POUR BTL (IV SOLUTION) ×3 IMPLANT
PAD ARMBOARD 7.5X6 YLW CONV (MISCELLANEOUS) ×3 IMPLANT
POUCH SPECIMEN RETRIEVAL 10MM (ENDOMECHANICALS) ×2 IMPLANT
SCISSORS LAP 5X35 DISP (ENDOMECHANICALS) ×2 IMPLANT
SET CHOLANGIOGRAPH 5 50 .035 (SET/KITS/TRAYS/PACK) ×1 IMPLANT
SET IRRIG TUBING LAPAROSCOPIC (IRRIGATION / IRRIGATOR) ×3 IMPLANT
SLEEVE ENDOPATH XCEL 5M (ENDOMECHANICALS) ×4 IMPLANT
SPECIMEN JAR SMALL (MISCELLANEOUS) ×3 IMPLANT
SUT VICRYL 0 UR6 27IN ABS (SUTURE) ×2 IMPLANT
SUT VICRYL 4-0 PS2 18IN ABS (SUTURE) ×2 IMPLANT
TOWEL OR 17X24 6PK STRL BLUE (TOWEL DISPOSABLE) ×3 IMPLANT
TOWEL OR 17X26 10 PK STRL BLUE (TOWEL DISPOSABLE) ×3 IMPLANT
TRAY LAPAROSCOPIC (CUSTOM PROCEDURE TRAY) ×3 IMPLANT
TROCAR XCEL BLUNT TIP 100MML (ENDOMECHANICALS) ×2 IMPLANT
TROCAR XCEL NON-BLD 5MMX100MML (ENDOMECHANICALS) ×2 IMPLANT

## 2012-04-21 NOTE — Interval H&P Note (Signed)
History and Physical Interval Note:  04/21/2012 9:46 AM  Brenda Garcia  has presented today for surgery, with the diagnosis of cholelithiasis  The various methods of treatment have been discussed with the patient and family. After consideration of risks, benefits and other options for treatment, the patient has consented to  Procedure(s) (LRB) with comments: LAPAROSCOPIC CHOLECYSTECTOMY WITH INTRAOPERATIVE CHOLANGIOGRAM (N/A) as a surgical intervention .  The patient's history has been reviewed, patient examined, no change in status, stable for surgery.  I have reviewed the patient's chart and labs.    The anatomy & physiology of hepatobiliary & pancreatic function was discussed.  The pathophysiology of gallbladder dysfunction was discussed.  Natural history risks without surgery was discussed.   I feel the risks of no intervention will lead to serious problems that outweigh the operative risks; therefore, I recommended cholecystectomy to remove the pathology.  I explained laparoscopic techniques with possible need for an open approach.  Probable cholangiogram to evaluate the bilary tract was explained as well.    Risks such as bleeding, infection, abscess, leak, injury to other organs, need for further treatment, heart attack, death, and other risks were discussed.  I noted a good likelihood this will help address the problem.  Possibility that this will not correct all abdominal symptoms was explained.  Goals of post-operative recovery were discussed as well.  We will work to minimize complications.  An educational handout further explaining the pathology and treatment options was given as well.  Questions were answered.  The patient expresses understanding & wishes to proceed with surgery.

## 2012-04-21 NOTE — Anesthesia Preprocedure Evaluation (Signed)
Anesthesia Evaluation  Patient identified by MRN, date of birth, ID band Patient awake    Reviewed: Allergy & Precautions, H&P , NPO status , Patient's Chart, lab work & pertinent test results  Airway Mallampati: III TM Distance: >3 FB Neck ROM: Full    Dental No notable dental hx. (+) Teeth Intact, Dental Advisory Given and Chipped   Pulmonary Current Smoker,  breath sounds clear to auscultation  Pulmonary exam normal       Cardiovascular negative cardio ROS  Rhythm:Regular Rate:Normal     Neuro/Psych negative neurological ROS  negative psych ROS   GI/Hepatic negative GI ROS, Neg liver ROS,   Endo/Other  negative endocrine ROS  Renal/GU negative Renal ROS  negative genitourinary   Musculoskeletal   Abdominal   Peds  Hematology negative hematology ROS (+)   Anesthesia Other Findings   Reproductive/Obstetrics negative OB ROS                           Anesthesia Physical Anesthesia Plan  ASA: II  Anesthesia Plan: General   Post-op Pain Management:    Induction: Intravenous  Airway Management Planned: Oral ETT  Additional Equipment:   Intra-op Plan:   Post-operative Plan: Extubation in OR  Informed Consent: I have reviewed the patients History and Physical, chart, labs and discussed the procedure including the risks, benefits and alternatives for the proposed anesthesia with the patient or authorized representative who has indicated his/her understanding and acceptance.   Dental advisory given  Plan Discussed with: CRNA  Anesthesia Plan Comments:         Anesthesia Quick Evaluation

## 2012-04-21 NOTE — Transfer of Care (Signed)
Immediate Anesthesia Transfer of Care Note  Patient: Brenda Garcia  Procedure(s) Performed: Procedure(s) (LRB) with comments: LAPAROSCOPIC CHOLECYSTECTOMY (N/A)  Patient Location: PACU  Anesthesia Type: General  Level of Consciousness: awake, oriented, responds to stimulation  Airway & Oxygen Therapy: Patient Spontanous Breathing and Patient connected to nasal cannula oxygen  Post-op Assessment: Report given to PACU RN and Post -op Vital signs reviewed and stable  Post vital signs: Reviewed and stable  Complications: No apparent anesthesia complications

## 2012-04-21 NOTE — Preoperative (Signed)
Beta Blockers   Reason not to administer Beta Blockers:Not Applicable 

## 2012-04-21 NOTE — Anesthesia Postprocedure Evaluation (Signed)
  Anesthesia Post-op Note  Patient: Brenda Garcia  Procedure(s) Performed: Procedure(s) (LRB) with comments: LAPAROSCOPIC CHOLECYSTECTOMY (N/A)  Patient Location: PACU  Anesthesia Type: General  Level of Consciousness: awake and alert   Airway and Oxygen Therapy: Patient Spontanous Breathing  Post-op Pain: mild  Post-op Assessment: Post-op Vital signs reviewed, Patient's Cardiovascular Status Stable, Respiratory Function Stable, Patent Airway and No signs of Nausea or vomiting  Post-op Vital Signs: Reviewed and stable  Complications: No apparent anesthesia complications

## 2012-04-21 NOTE — Op Note (Signed)
04/20/2012 - 04/21/2012  11:48 AM  PATIENT:  Brenda Garcia  31 y.o. female  Patient Care Team: Per Patient No Pcp as PCP - General  PRE-OPERATIVE DIAGNOSIS:  cholelithiasis  POST-OPERATIVE DIAGNOSIS:  cholelithiasis  PROCEDURE:  Procedure(s): LAPAROSCOPIC CHOLECYSTECTOMY  SURGEON:  Surgeon(s): Romie Levee, MD Violeta Gelinas, MD  ANESTHESIA:   local and general  EBL:  Total I/O In: 1000 [I.V.:1000] Out: -   Delay start of Pharmacological VTE agent (>24hrs) due to surgical blood loss or risk of bleeding:  no  DRAINS: none   SPECIMEN:  Source of Specimen:  Gallbladder  DISPOSITION OF SPECIMEN:  PATHOLOGY  COUNTS:  YES  PLAN OF CARE: inpatient  PATIENT DISPOSITION:  PACU - hemodynamically stable.  INDICATION: This is a 31 y.o. female who presented to the ED with RUQ pain and nausea.  An US revealed a large gallbladder neck stone but no signs of inflammation.  Her pain was unable to be controlled on PO medications and she was admitted.  The risks and benefits of laparoscopic cholecystectomy were explained to the patient prior to OR and consent was signed and placed on the chart.  OR FINDINGS: Chronically inflamed gallbladder with stone in neck, significant edema in the neck as well  DESCRIPTION:  The patient was identified & brought into the operating room. The patient was positioned supine with arms tucked. SCDs were active during the entire case. The patient underwent general anesthesia without any difficulty.  The abdomen was prepped and draped in a sterile fashion. A Surgical Timeout confirmed our plan.  We positioned the patient in reverse [place] & right side up.   I placed a 10mm Hassan port through the abdominal wall using open technique in the infraumbilical region.  Entry was clean.  We induced carbon dioxide insufflation. Camera inspection revealed no injury.  There were no adhesions to the anterior abdominal wall periumbilically.  I proceeded to continue  with laparoscopic technique. I placed a #5 port in subxiphoid region obliquely within the falciform ligament, another 5mm port in the right flank near the anterior axillary line, and a 5mm port in the RUQ region.  I turned attention to the right upper quadrant.    The gallbladder fundus was elevated cephalad. I used hook cautery and blunt dissection to free the peritoneal coverings between the gallbladder and the liver on the posteriolateral and anteriomedial walls.   I used careful blunt and hook dissection to help get a good critical view of the cystic artery and cystic duct. I did further dissection to free a few centimeters of the  gallbladder off the liver bed to get a good critical view of the infundibulum and cystic duct. I mobilized the cystic artery; and, after getting a good 360 view, ligated the cystic artery using clips. I skeletonized the cystic duct.  I placed a clip on the infundibulum.  I placed 2 more clips on the cystic duct distally.   I completed cystic duct transection. I freed the gallbladder from its remaining attachments to the liver. I ensured hemostasis on the gallbladder fossa of the liver and elsewhere. I inspected the rest of the abdomen & detected no injury nor bleeding elsewhere.  I removed the gallbladder.  I closed the umbilical fascia transversely using 0 Vicryl interrupted stitches. The dermis was closed using 4-0 vicryl stitch.  Dermabond was applied. The patient was extubated & arrived in the PACU in stable condition.  I had discussed postoperative care with the patient in the holding  area. I have discussed my findings with the patient's family as well as postoperative goals / instructions.  Instructions are written in the chart as well.

## 2012-04-22 ENCOUNTER — Encounter (INDEPENDENT_AMBULATORY_CARE_PROVIDER_SITE_OTHER): Payer: Self-pay | Admitting: Internal Medicine

## 2012-04-22 ENCOUNTER — Encounter (HOSPITAL_COMMUNITY): Payer: Self-pay | Admitting: General Surgery

## 2012-04-22 MED ORDER — OXYCODONE HCL 5 MG PO TABS: 5.0000 mg | ORAL_TABLET | Freq: Four times a day (QID) | ORAL | Status: DC | PRN

## 2012-04-22 MED ORDER — OXYCODONE HCL 5 MG PO TABS
5.0000 mg | ORAL_TABLET | Freq: Four times a day (QID) | ORAL | Status: DC | PRN
  Administered 2012-04-22: 5 mg via ORAL
  Filled 2012-04-22: qty 1

## 2012-04-22 NOTE — Progress Notes (Signed)
Patient discharged to home with instructions and verbalized understanding. 

## 2012-04-22 NOTE — Discharge Summary (Signed)
Physician Discharge Summary  Patient ID: Brenda Garcia MRN: [id number] DOB/AGE: 32-16-1982 32 y.o.  Admit date: 04/20/2012 Discharge date: 04/22/2012  Admitting Diagnosis: Cholelithiasis Bilary Colic   Discharge Diagnosis Cholelithiasis Bilary Colic   Consultants None  Procedures Laparoscopic Cholecystectomy with [facility]  Hospital Course 31 yr old female who presented to [facility] with abdominal pain.  Workup showed cholelithiasis with a stone stuck in the neck of the gallbladder.  Her LFTs were normal and there was no sign of CBD obstruction.  Patient was admitted and underwent procedure listed above.  Tolerated procedure well and was transferred to the floor.  Diet was advanced as tolerated.  On POD#1, the patient was voiding well, tolerating diet, ambulating well, pain well controlled, vital signs stable, incisions c/d/i and felt stable for discharge home.  Patient will follow up in our office in 2 weeks and knows to call with questions or concerns.    Medication List     As of 04/22/2012  9:29 AM    TAKE these medications         MOTRIN PM PO   Take 1 tablet by mouth at bedtime as needed. For sleep      oxyCODONE 5 MG immediate release tablet   Commonly known as: Oxy IR/ROXICODONE   Take 1-2 tablets (5-10 mg total) by mouth every 6 (six) hours as needed.             Follow-up Information    Follow up with Vanita Panda., MD. Schedule an appointment as soon as possible for a visit in 2 weeks. (Call our office to schedule your appointment to see Dr. Maisie Fus in 2 weeks.)    Contact information:   [address] [place] [address] [phone]          Signed: Clance Boll [facility] [phone]  04/22/2012, 9:29 AM

## 2012-04-22 NOTE — Discharge Summary (Signed)
ATTENDING ADDENDUM:  I personally reviewed patient's record, examined the patient, and formulated the following plan:  She is doing well post op.  Pain appropriate.  D/c home

## 2012-04-22 NOTE — Discharge Instructions (Signed)
[facility] ______[facility] °LAPAROSCOPIC SURGERY: POST OP INSTRUCTIONS °Always review your discharge instruction sheet given to you by the facility where your surgery was performed. °IF YOU HAVE DISABILITY OR FAMILY LEAVE FORMS, YOU MUST BRING THEM TO THE OFFICE FOR PROCESSING.   °DO NOT GIVE THEM TO YOUR DOCTOR. ° °1. A prescription for pain medication may be given to you upon discharge.  Take your pain medication as prescribed, if needed.  If narcotic pain medicine is not needed, then you may take acetaminophen (Tylenol) or ibuprofen (Advil) as needed. °2. Take your usually prescribed medications unless otherwise directed. °3. If you need a refill on your pain medication, please contact your pharmacy.  They will contact our office to request authorization. Prescriptions will not be filled after 5pm or on week-ends. °4. You should follow a light diet the first few days after arrival home, such as soup and crackers, etc.  Be sure to include lots of fluids daily. °5. Most patients will experience some swelling and bruising in the area of the incisions.  Ice packs will help.  Swelling and bruising can take several days to resolve.  °6. It is common to experience some constipation if taking pain medication after surgery.  Increasing fluid intake and taking a stool softener (such as Colace) will usually help or prevent this problem from occurring.  A mild laxative (Milk of Magnesia or Miralax) should be taken according to package instructions if there are no bowel movements after 48 hours. °7. Unless discharge instructions indicate otherwise, you may remove your bandages 24-48 hours after surgery, and you may shower at that time.  You may have steri-strips (small skin tapes) in place directly over the incision.  These strips should be left on the skin for 7-10 days.  If your surgeon used skin glue on the incision, you may shower in 24 hours.  The glue will flake off over the next 2-3 weeks.  Any sutures or  staples will be removed at the office during your follow-up visit. °8. ACTIVITIES:  You may resume regular (light) daily activities beginning the next day--such as daily self-care, walking, climbing stairs--gradually increasing activities as tolerated.  You may have sexual intercourse when it is comfortable.  Refrain from any heavy lifting or straining until approved by your doctor. °a. You may drive when you are no longer taking prescription pain medication, you can comfortably wear a seatbelt, and you can safely maneuver your car and apply brakes. °b. RETURN TO WORK:  __________________________________________________________ °9. You should see your doctor in the office for a follow-up appointment approximately 2-3 weeks after your surgery.  Make sure that you call for this appointment within a day or two after you arrive home to insure a convenient appointment time. °10. OTHER INSTRUCTIONS: __________________________________________________________________________________________________________________________ __________________________________________________________________________________________________________________________ °WHEN TO CALL YOUR DOCTOR: °1. Fever over 101.0 °2. Inability to urinate °3. Continued bleeding from incision. °4. Increased pain, redness, or drainage from the incision. °5. Increasing abdominal pain ° °The clinic staff is available to answer your questions during regular business hours.  Please don’t hesitate to call and ask to speak to one of the nurses for clinical concerns.  If you have a medical emergency, go to the nearest emergency room or call 911.  A surgeon from [facility] is always on call at the hospital. °[address], [address], [place], [place]  [address] ? [address] [address], [place], [place]   [address] °[phone] ? [phone] ? FAX [phone] °Web site:   [email or web address] °

## 2012-05-12 ENCOUNTER — Ambulatory Visit (INDEPENDENT_AMBULATORY_CARE_PROVIDER_SITE_OTHER): Payer: BC Managed Care – PPO | Admitting: General Surgery

## 2012-05-12 ENCOUNTER — Encounter (INDEPENDENT_AMBULATORY_CARE_PROVIDER_SITE_OTHER): Payer: Self-pay | Admitting: General Surgery

## 2012-05-12 VITALS — BP 113/64 | HR 68 | Temp 97.0°F | Resp 12 | Ht 69.0 in | Wt 239.8 lb

## 2012-05-12 DIAGNOSIS — Z9049 Acquired absence of other specified parts of digestive tract: Secondary | ICD-10-CM

## 2012-05-12 DIAGNOSIS — Z9089 Acquired absence of other organs: Secondary | ICD-10-CM

## 2012-05-12 NOTE — Progress Notes (Signed)
Brenda Garcia is a 31 y.o. female who is status post a lap chole on 10/10.  She is doing well.  Her pain is better.  She is eating well.  Her bowel movements are regular.  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  Assessment: s/p  Lap chole, doing well  Plan:  F/U PRN   .Vanita Panda, MD [facility], [place] [phone]   05/12/2012 12:05 PM

## 2012-05-12 NOTE — Patient Instructions (Signed)
Ok to return to normal physical activity.  Do not lift anything >20lbs for at least 6 weeks after surgery.

## 2012-06-02 IMAGING — US US OB FOLLOW-UP
1 series · 15 of 28 positions shown · non-contrast
Comparison: none

[Series 1: us ob follow-up · 15 of 54 slices shown]
[im 1/54]
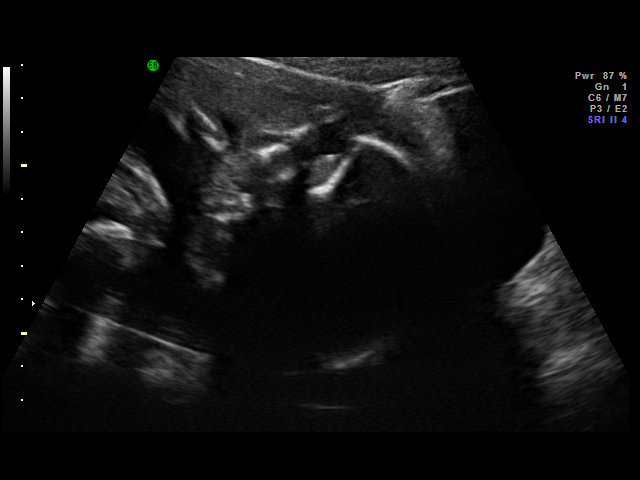
[im 4/54]
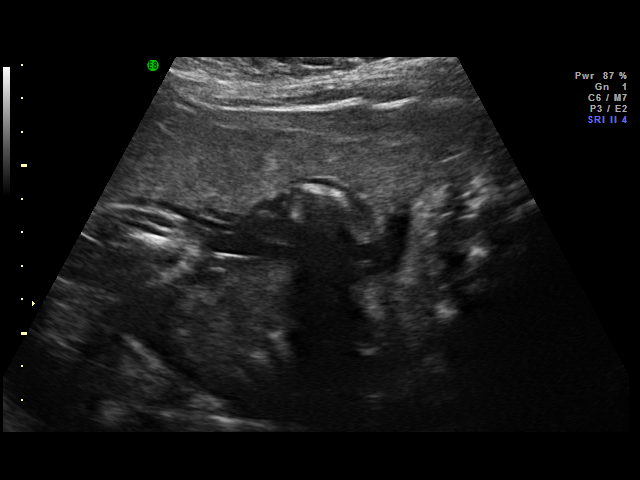
[im 8/54]
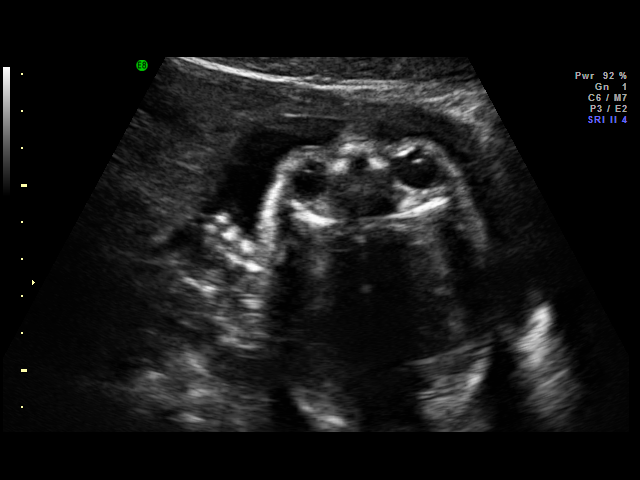
[im 12/54]
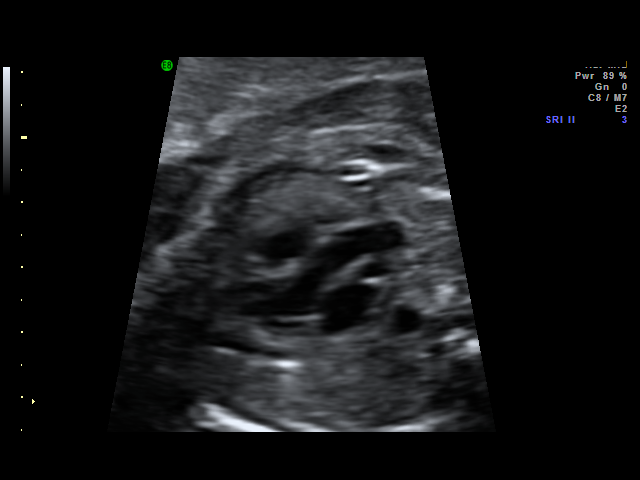
[im 16/54]
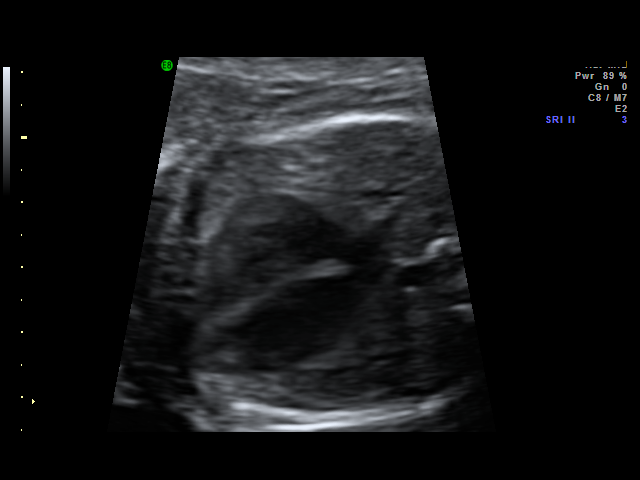
[im 20/54]
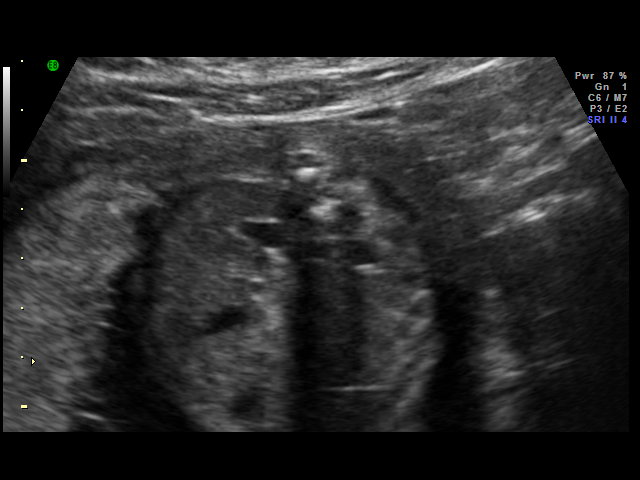
[im 24/54]
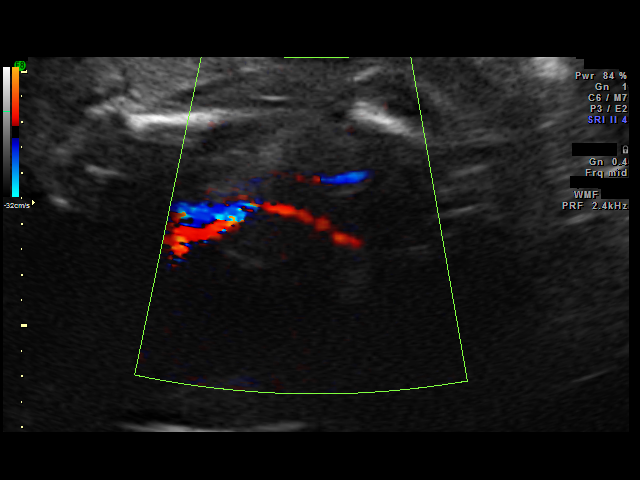
[im 28/54]
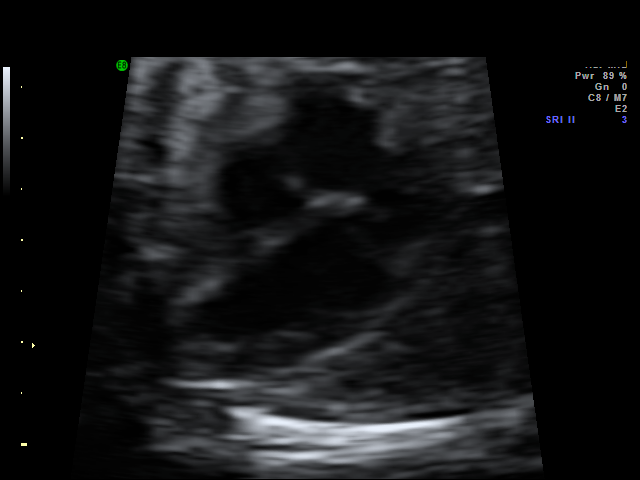
[im 30/54]
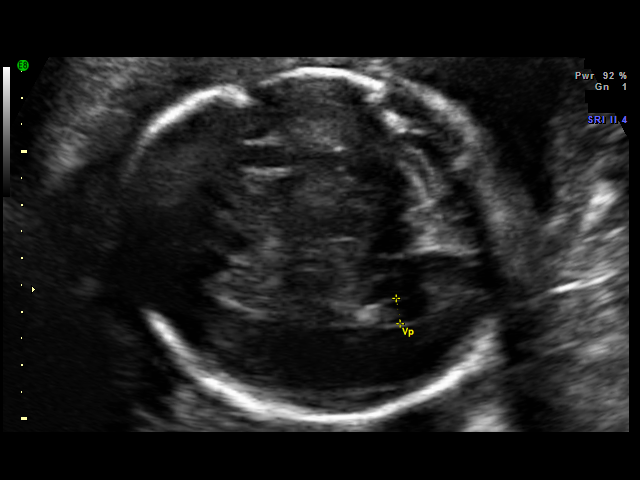
[im 34/54]
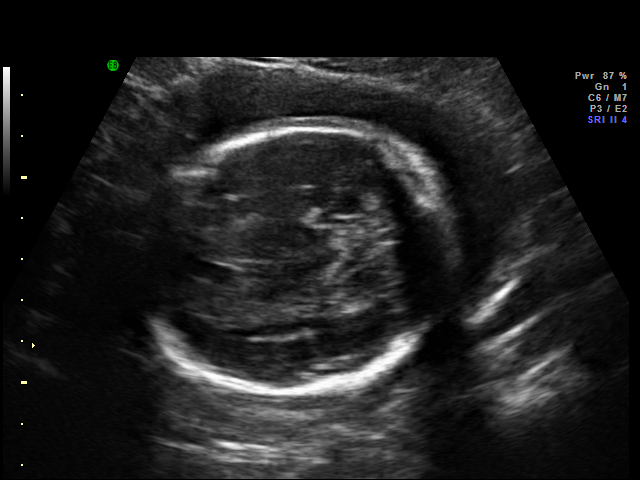
[im 38/54]
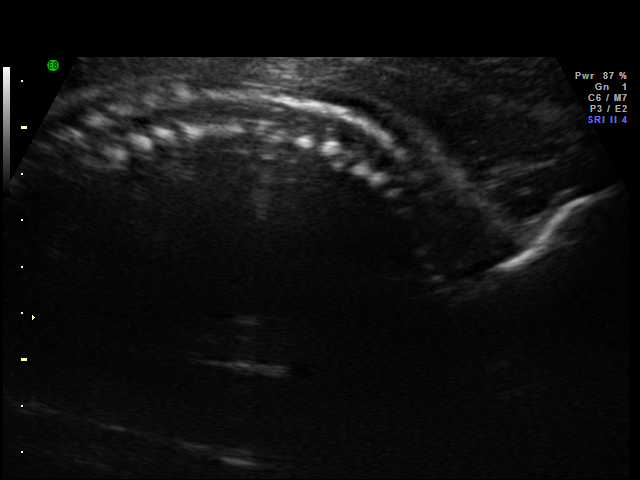
[im 42/54]
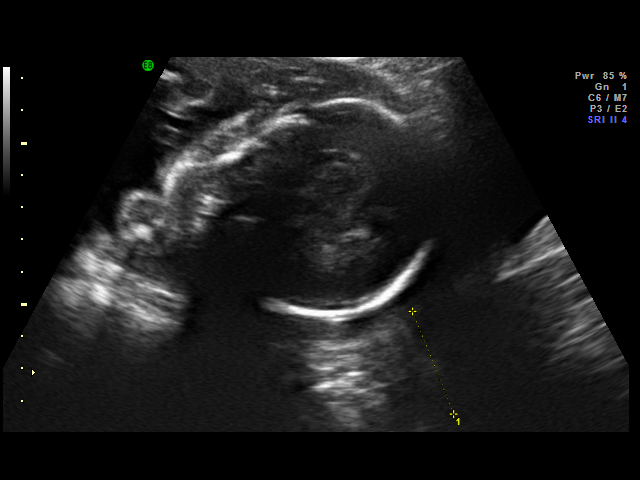
[im 46/54]
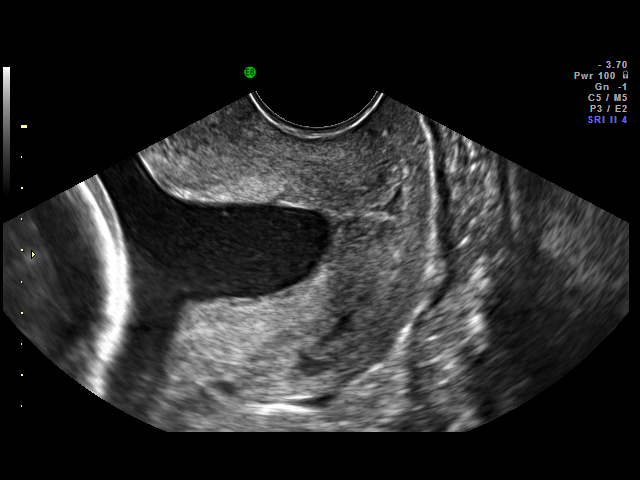
[im 50/54]
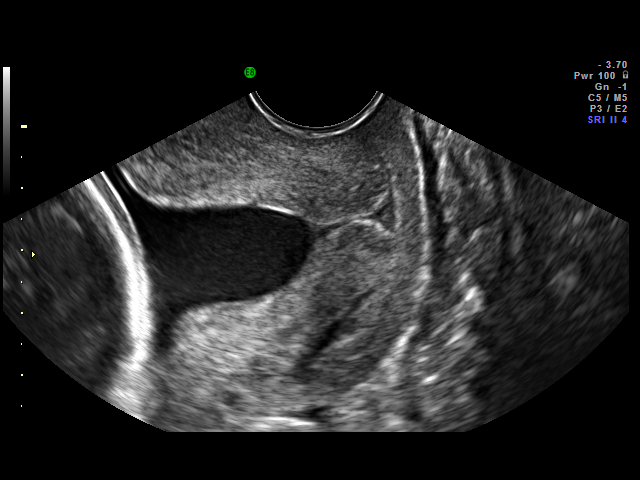
[im 54/54]
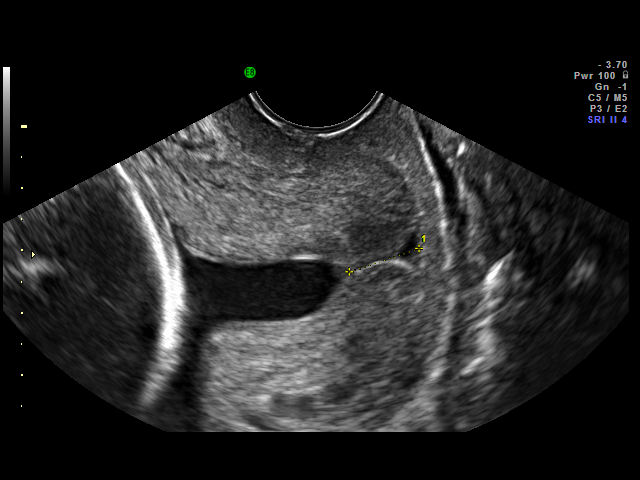

[15 of 28 positions shown; findings below may reference images not displayed]

OBSTETRICS REPORT
                      (Signed Final 06/09/2010 [DATE])

 Order#:         _O,_O
Procedures

 US OB FOLLOW UP                                       76816.1
 US OB TRANSVAGINAL MODIFY                             76817.1
Indications

 History of pre-term delivery at 27 weeks
 Assess Fetal Growth / Estimated Fetal Weight
 Pyelectasis
Fetal Evaluation

 Fetal Heart Rate:  150                          bpm
 Cardiac Activity:  Observed
 Presentation:      Cephalic
 Placenta:          Anterior, above cervical os
 P. Cord            Visualized
 Insertion:

 Amniotic Fluid
 AFI FV:      Subjectively within normal limits
Biometry

 BPD:     62.9  mm     G. Age:  25w 3d                CI:         76.5   70 - 86
 OFD:     82.2  mm                                    FL/HC:      20.1   18.7 -

 HC:     234.9  mm     G. Age:  25w 3d       81  %    HC/AC:      1.16   1.05 -

 AC:     203.3  mm     G. Age:  25w 0d       66  %    FL/BPD:     74.9   71 - 87
 FL:      47.1  mm     G. Age:  25w 5d       83  %    FL/AC:      23.2   20 - 24
 HUM:     43.2  mm     G. Age:  25w 5d       83  %
 CER:     27.2  mm     G. Age:  24w 4d       61  %

 Est. FW:     794  gm    1 lb 12 oz      75  %
Gestational Age

 LMP:           19w 3d        Date:  01/24/10                 EDD:   10/31/10
 U/S Today:     25w 3d                                        EDD:   09/19/10
 Best:          24w 1d     Det. By:  Early Ultrasound         EDD:   09/28/10
Anatomy

 Cranium:           Appears normal      Aortic Arch:       Previously seen
 Fetal Cavum:       Appears normal      Ductal Arch:       Previously seen
 Ventricles:        Appears normal      Diaphragm:         Appears normal
 Choroid Plexus:    Appears normal      Stomach:           Appears
                                                           normal, left
                                                           sided
 Cerebellum:        Appears normal      Abdomen:           Appears normal
 Posterior Fossa:   Appears normal      Abdominal Wall:    Appears nml
                                                           (cord insert,
                                                           abd wall)
 Nuchal Fold:       Previously seen     Cord Vessels:      Appears normal
                                                           (3 vessel cord)
 Face:              Appears normal      Kidneys:           Appear normal
                    (lips/profile/orbit
                    s)
 Heart:             Appears normal      Bladder:           Appears normal
                    (4 chamber &
                    axis)
 RVOT:              Appears normal      Spine:             Previously seen
 LVOT:              Appears normal      Limbs:             Previously seen

 Other:     Fetus appears to be a male. Heels and 5th digit
            previously visualized. Nasal bone previously visualized.
Targeted Anatomy

 Fetal Central Nervous System
 Cisterna Magna:
Cervix Uterus Adnexa

 Cervical Length:    1.2      cm

 Cervix:       Measured transvaginally. Appears funnelled, see
               comments.
 Uterus:       No abnormality visualized.
 Left Ovary:    Not visualized.
 Right Ovary:   Not visualized.
 Adnexa:     No adnexal mass visualized.
Comments

 Cervical length is measuring 1.2 cm with a "U" shaped funnel.

 Prior pylectasis has now resolved measuring less than 6 mm
 bilaterally.

 I have discussed our findings with Dr. Moreno and we are in
 agreement that the patient should be admitted to [REDACTED].
Impression

 Single living intrauterine pregnancy at 24 weeks 1 days.
 Appropriate interval growth (75%).
 Cervical shortening with funelling.
Recommendations

 Admission to [HOSPITAL] [HOSPITAL] for
 [facility] and monitoring.

 questions or concerns.
                Hashemi, Kelvin

## 2013-12-01 ENCOUNTER — Telehealth: Payer: Self-pay | Admitting: *Deleted

## 2013-12-01 NOTE — Telephone Encounter (Signed)
Pt called into office for refill of medication, Valtrex. Last appt was 02/2011. Pt needs AEX appt before refill.

## 2013-12-19 NOTE — Telephone Encounter (Signed)
[id number] - [facility] -  PATIENT SCHEDULED AEX FOR [phone]

## 2013-12-19 NOTE — Telephone Encounter (Signed)
[id number] - [facility] - LEFT PATIENT VM TO PLEASE CALL AND SCHEDULE AEX.

## 2014-02-12 ENCOUNTER — Other Ambulatory Visit: Payer: Self-pay | Admitting: *Deleted

## 2014-02-12 DIAGNOSIS — B009 Herpesviral infection, unspecified: Secondary | ICD-10-CM

## 2014-02-12 MED ORDER — VALACYCLOVIR HCL 500 MG PO TABS: 500.0000 mg | ORAL_TABLET | Freq: Two times a day (BID) | ORAL | Status: AC

## 2014-02-12 NOTE — Telephone Encounter (Signed)
Patient called requesting a refill of her Valtrex- she has appointment on 8/26 for annual. Rx sent to pharmacy.

## 2014-03-07 ENCOUNTER — Ambulatory Visit: Payer: BC Managed Care – PPO | Admitting: Obstetrics & Gynecology

## 2014-04-14 IMAGING — US US ABDOMEN COMPLETE
1 series · 14 of 25 positions shown · non-contrast
Comparison: CT abdomen pelvis 03/06/2009.

CLINICAL DATA: Abdominal pain with nausea and vomiting.

COMPLETE ABDOMINAL ULTRASOUND

[Series 1: us abdomen complete · 0.32mm/px · 14 of 86 slices shown]
[im 1/86]
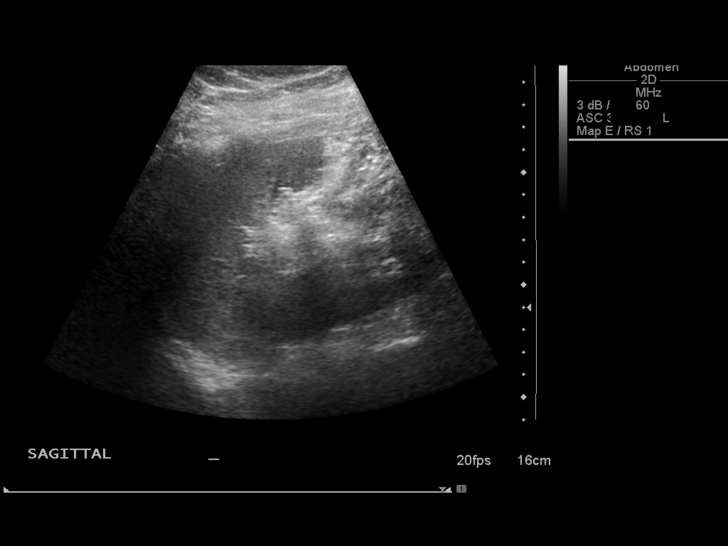
[im 8/86]
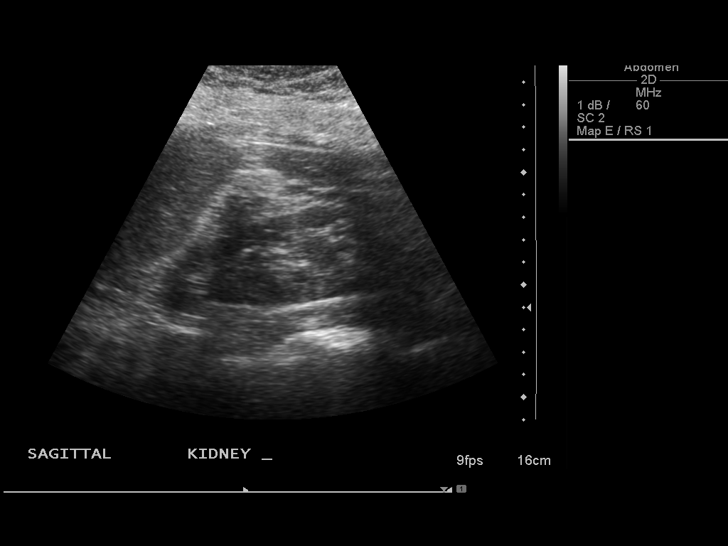
[im 15/86]
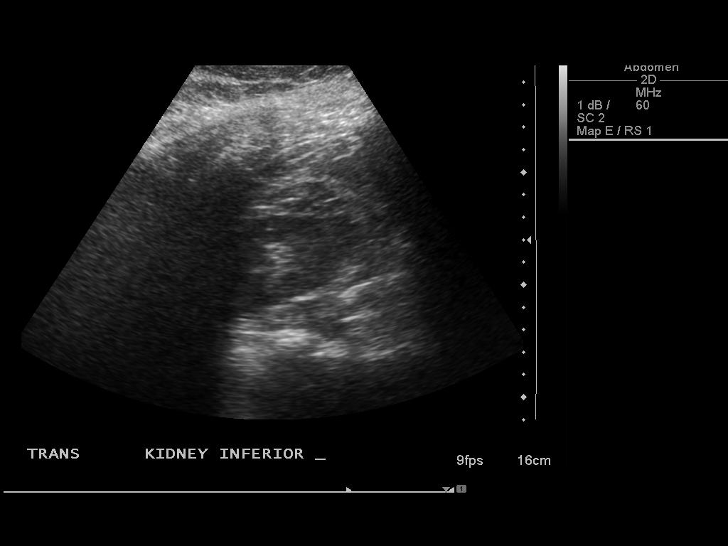
[im 22/86]
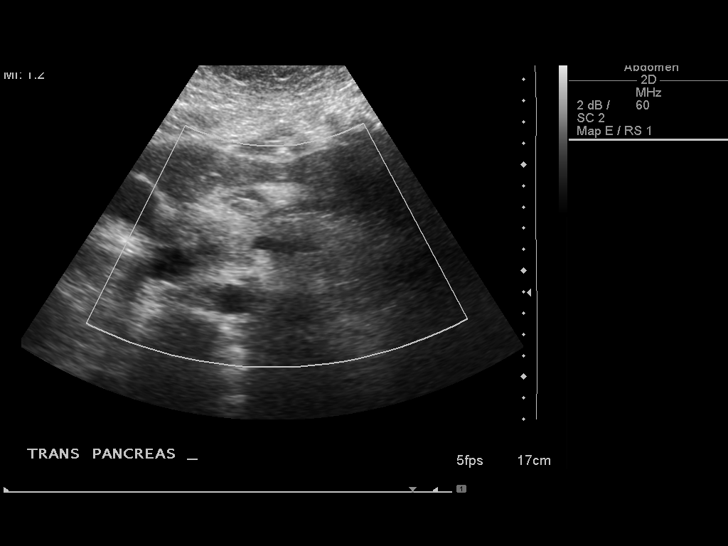
[im 29/86]
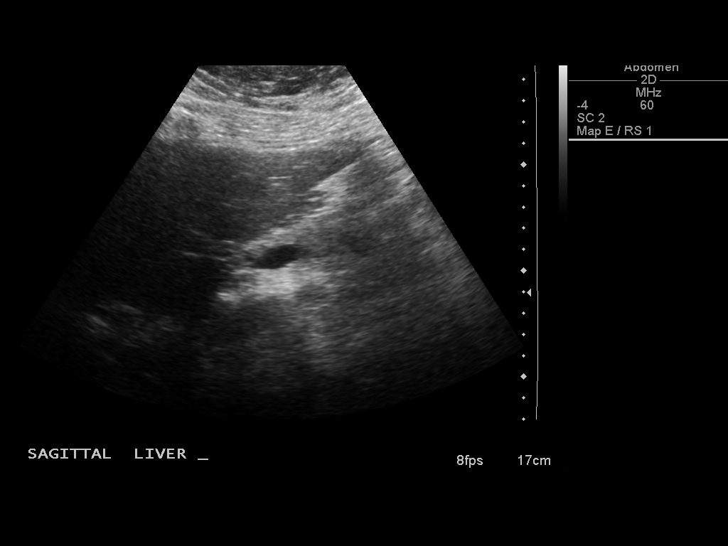
[im 32/86]
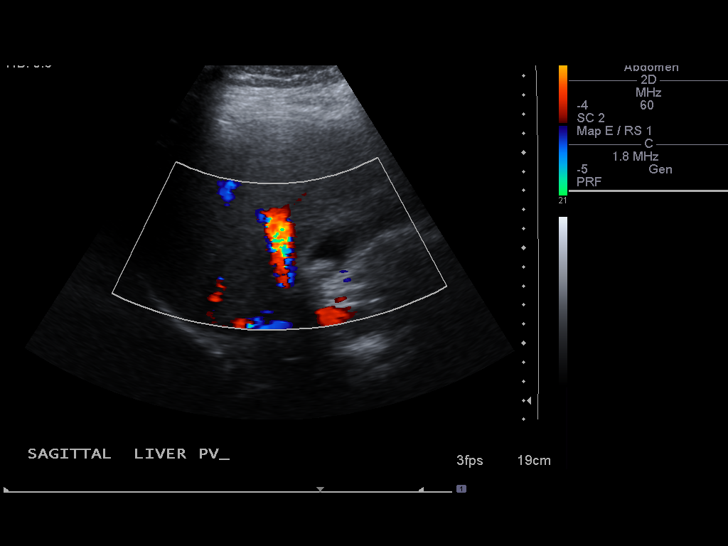
[im 39/86]
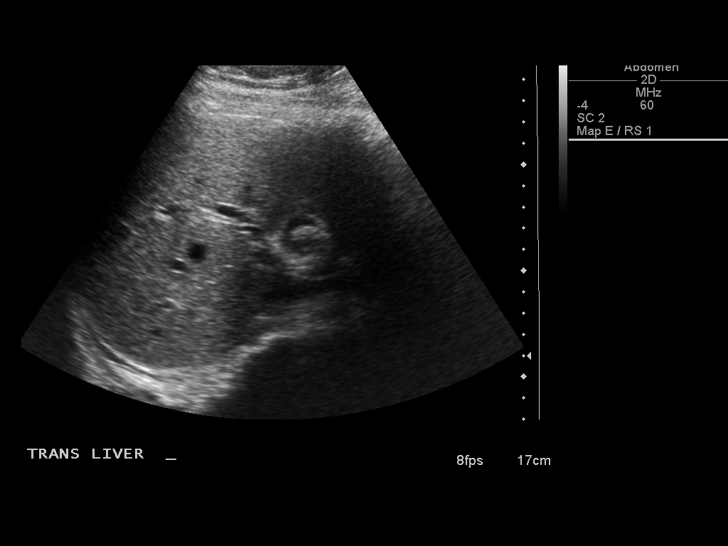
[im 47/86]
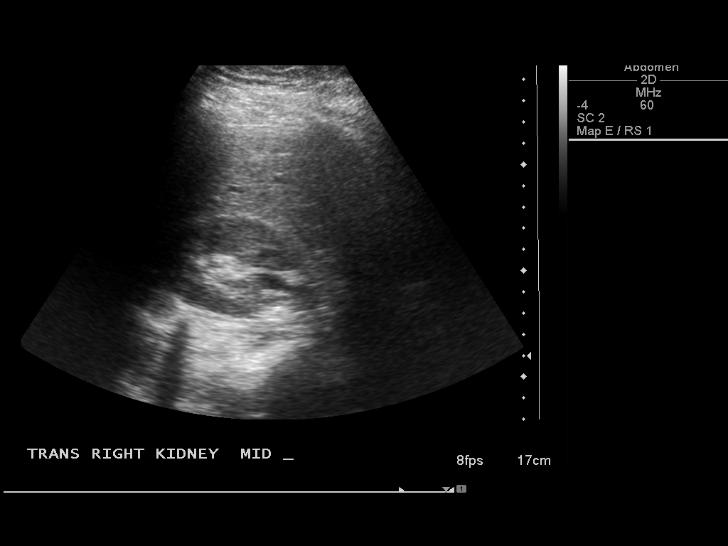
[im 54/86]
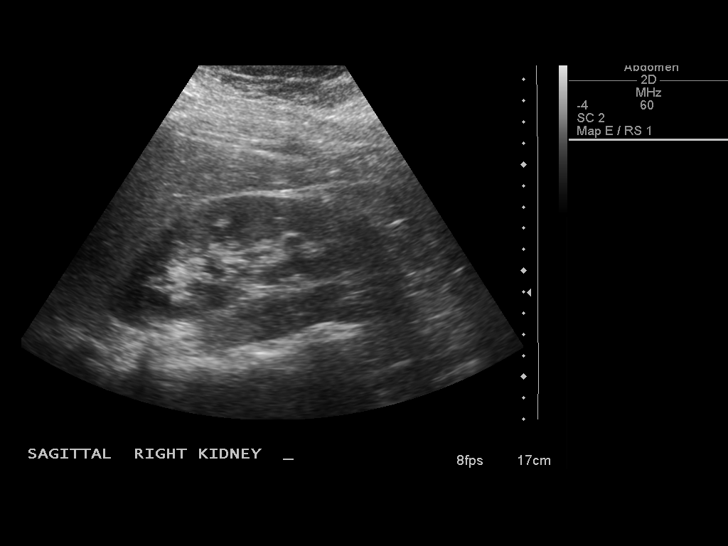
[im 57/86]
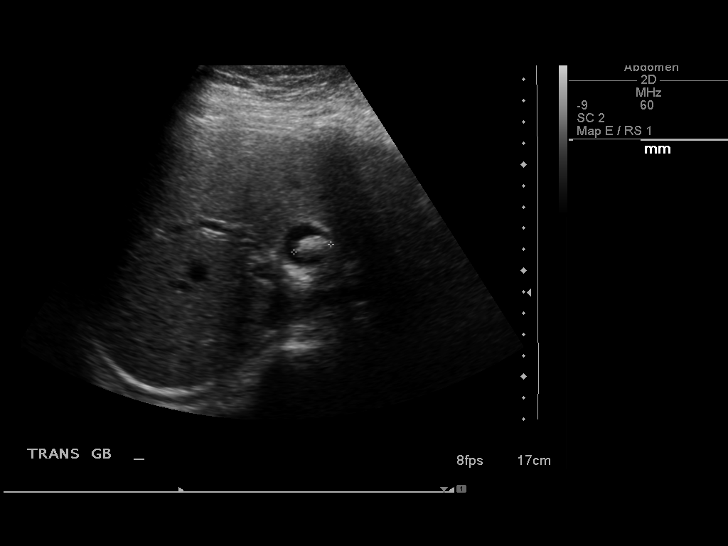
[im 64/86]
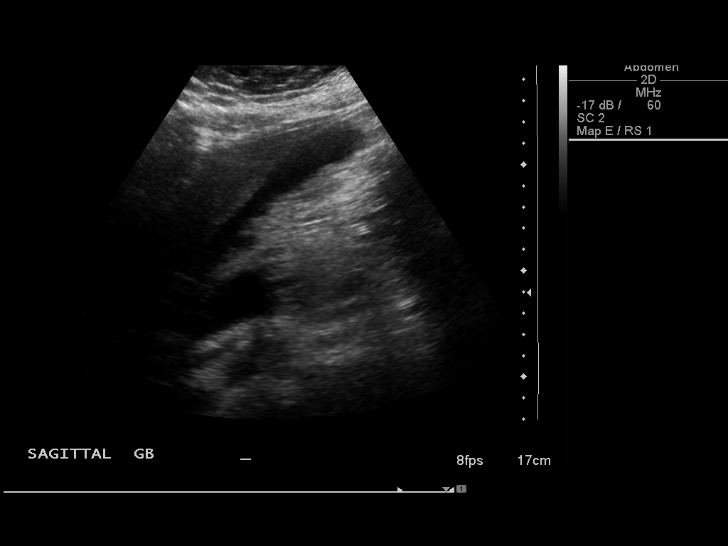
[im 71/86]
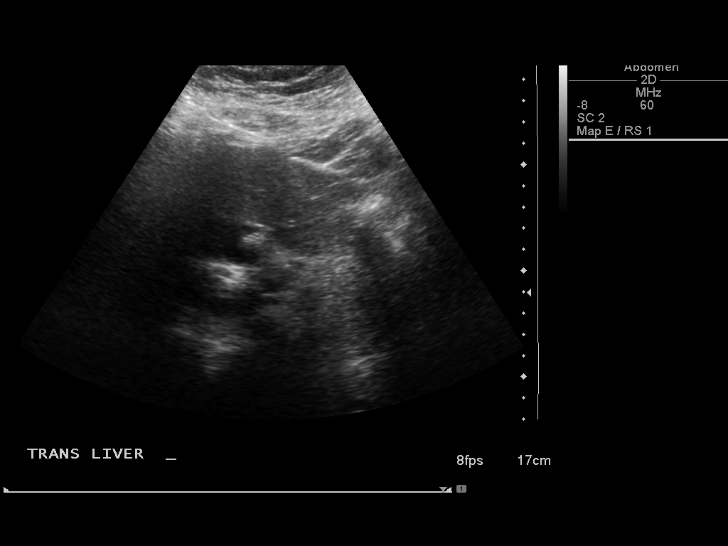
[im 78/86]
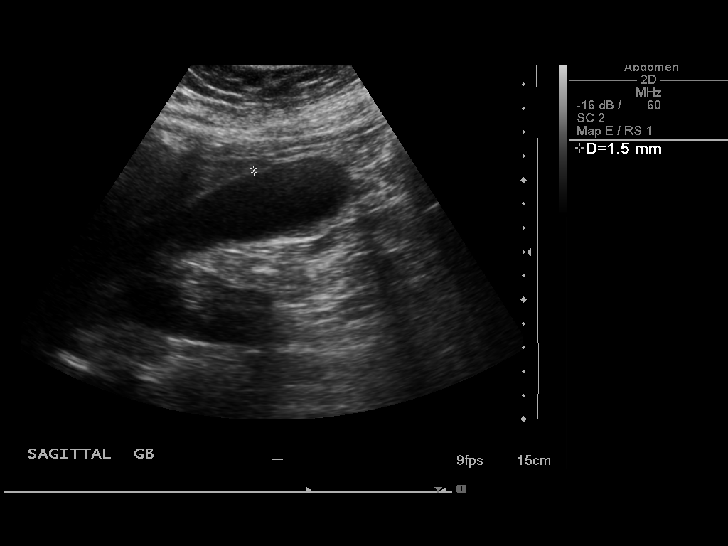
[im 86/86]
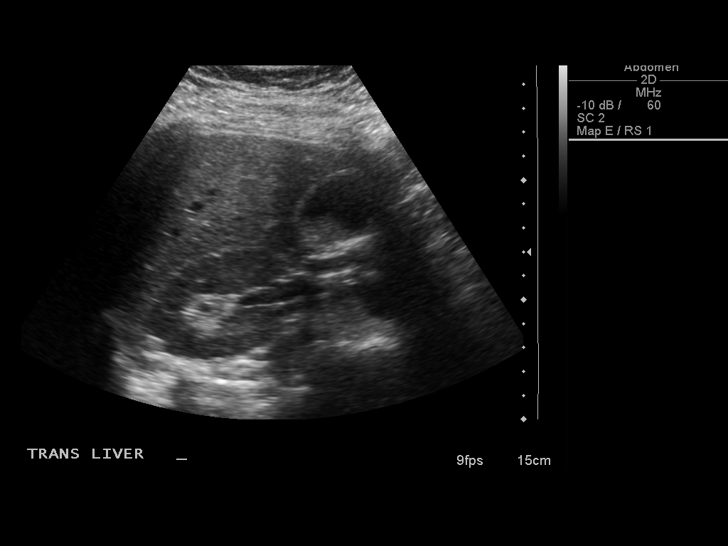

[14 of 25 positions shown; findings below may reference images not displayed]

FINDINGS: Gallbladder:  Shadowing echogenic stones measure up to 1.8 cm,
located in the neck of the gallbladder.  Nonshadowing echogenic
sludge is seen as well.  Gallbladder wall measures 2 mm.
Sonographic Murphy's sign is reportedly present.

Common bile duct:  Measures 5 mm, within normal limits.

Liver:  No focal lesion identified.  Within normal limits in
parenchymal echogenicity.

IVC:  Appears normal.

Pancreas:  No focal abnormality seen.

Spleen:  Measures 8.4 cm, negative.

Right Kidney:  Measures 14.0 cm.  Parenchymal echogenicity is
normal.  No hydronephrosis.  No focal lesions.

Left Kidney:  Measures 15.1 cm.  Parenchymal echogenicity is
normal.  No hydronephrosis.  No focal lesions.

Abdominal aorta:  No aneurysm identified.
IMPRESSION: Gallstones and sludge with a stone in the gallbladder neck.  No
associated wall thickening or pericholecystic fluid.  Positive
sonographic Murphy's sign.  Please correlate clinically for acute
cholecystitis.

## 2014-05-14 ENCOUNTER — Encounter (INDEPENDENT_AMBULATORY_CARE_PROVIDER_SITE_OTHER): Payer: Self-pay | Admitting: General Surgery

## 2014-07-09 ENCOUNTER — Encounter: Payer: Self-pay | Admitting: *Deleted

## 2014-07-10 ENCOUNTER — Encounter: Payer: Self-pay | Admitting: Obstetrics & Gynecology

## 2016-10-06 ENCOUNTER — Emergency Department (HOSPITAL_COMMUNITY)
Admission: EM | Admit: 2016-10-06 | Discharge: 2016-10-06 | Disposition: A | Discharge status: Home / Self Care | Payer: BLUE CROSS/BLUE SHIELD | Attending: Emergency Medicine | Admitting: Emergency Medicine

## 2016-10-06 ENCOUNTER — Encounter (HOSPITAL_COMMUNITY): Payer: Self-pay

## 2016-10-06 ENCOUNTER — Emergency Department (HOSPITAL_COMMUNITY): Payer: BLUE CROSS/BLUE SHIELD

## 2016-10-06 DIAGNOSIS — N200 Calculus of kidney: Secondary | ICD-10-CM

## 2016-10-06 DIAGNOSIS — N130 Hydronephrosis with ureteropelvic junction obstruction: Secondary | ICD-10-CM | POA: Insufficient documentation

## 2016-10-06 DIAGNOSIS — F17200 Nicotine dependence, unspecified, uncomplicated: Secondary | ICD-10-CM | POA: Diagnosis not present

## 2016-10-06 DIAGNOSIS — R1032 Left lower quadrant pain: Secondary | ICD-10-CM | POA: Diagnosis present

## 2016-10-06 LAB — CBC
HEMATOCRIT: 40.7 % (ref 36.0–46.0)
Hemoglobin: 13.7 g/dL (ref 12.0–15.0)
MCH: 30.8 pg (ref 26.0–34.0)
MCHC: 33.7 g/dL (ref 30.0–36.0)
MCV: 91.5 fL (ref 78.0–100.0)
Platelets: 308 10*3/uL (ref 150–400)
RBC: 4.45 MIL/uL (ref 3.87–5.11)
RDW: 12.6 % (ref 11.5–15.5)
WBC: 7.6 10*3/uL (ref 4.0–10.5)

## 2016-10-06 LAB — COMPREHENSIVE METABOLIC PANEL
ALT: 14 U/L (ref 14–54)
ANION GAP: 8 (ref 5–15)
AST: 17 U/L (ref 15–41)
Albumin: 4 g/dL (ref 3.5–5.0)
Alkaline Phosphatase: 51 U/L (ref 38–126)
BILIRUBIN TOTAL: 1.3 mg/dL — AB (ref 0.3–1.2)
BUN: 5 mg/dL — ABNORMAL LOW (ref 6–20)
CO2: 21 mmol/L — ABNORMAL LOW (ref 22–32)
Calcium: 9 mg/dL (ref 8.9–10.3)
Chloride: 108 mmol/L (ref 101–111)
Creatinine, Ser: 0.78 mg/dL (ref 0.44–1.00)
GFR calc Af Amer: >60 mL/min (ref >60)
GFR calc non Af Amer: >60 mL/min (ref >60)
Glucose, Bld: 165 mg/dL — ABNORMAL HIGH (ref 65–99)
POTASSIUM: 4 mmol/L (ref 3.5–5.1)
Sodium: 137 mmol/L (ref 135–145)
TOTAL PROTEIN: 6.8 g/dL (ref 6.5–8.1)

## 2016-10-06 LAB — URINALYSIS, MICROSCOPIC (REFLEX)

## 2016-10-06 LAB — URINALYSIS, ROUTINE W REFLEX MICROSCOPIC
Bilirubin Urine: NEGATIVE
Glucose, UA: NEGATIVE mg/dL
KETONES UR: 15 mg/dL — AB
NITRITE: NEGATIVE
PH: 5.5 (ref 5.0–8.0)
Protein, ur: 100 mg/dL — AB

## 2016-10-06 LAB — LIPASE, BLOOD: Lipase: 11 U/L (ref 11–51)

## 2016-10-06 LAB — I-STAT BETA HCG BLOOD, ED (MC, WL, AP ONLY)

## 2016-10-06 MED ORDER — KETOROLAC TROMETHAMINE 60 MG/2ML IM SOLN
60.0000 mg | Freq: Once | INTRAMUSCULAR | Status: AC
  Administered 2016-10-06: 60 mg via INTRAMUSCULAR
  Filled 2016-10-06: qty 2

## 2016-10-06 MED ORDER — OXYCODONE-ACETAMINOPHEN 5-325 MG PO TABS
1.0000 | ORAL_TABLET | ORAL | Status: DC | PRN
Start: 2016-10-06 — End: 2016-10-06

## 2016-10-06 MED ORDER — HYDROCODONE-ACETAMINOPHEN 5-325 MG PO TABS: 1.0000 | ORAL_TABLET | Freq: Four times a day (QID) | ORAL | 0 refills | Status: AC | PRN

## 2016-10-06 MED ORDER — ONDANSETRON 4 MG PO TBDP: 4.0000 mg | ORAL_TABLET | Freq: Once | ORAL | Status: DC | PRN

## 2016-10-06 MED ORDER — TAMSULOSIN HCL 0.4 MG PO CAPS: 0.4000 mg | ORAL_CAPSULE | Freq: Every day | ORAL | 0 refills | Status: AC

## 2016-10-06 MED ORDER — OXYCODONE-ACETAMINOPHEN 5-325 MG PO TABS
ORAL_TABLET | ORAL | Status: AC
  Administered 2016-10-06: 1
  Filled 2016-10-06: qty 1

## 2016-10-06 MED ORDER — IBUPROFEN 800 MG PO TABS: 800.0000 mg | ORAL_TABLET | Freq: Four times a day (QID) | ORAL | 0 refills | Status: AC | PRN

## 2016-10-06 MED ORDER — ONDANSETRON 4 MG PO TBDP
ORAL_TABLET | ORAL | Status: AC
  Filled 2016-10-06: qty 1

## 2016-10-06 NOTE — ED Triage Notes (Signed)
Pt states she has had urinary frequency and urgency, treated with cipro. She reports left side flank pain has worsened over the last few days. Pt actively vomiting in the waiting room. Has only used aleve for pain control.

## 2016-10-06 NOTE — ED Notes (Signed)
IV removed from left wrist, and 2x2 applied with tape.

## 2016-10-06 NOTE — ED Notes (Signed)
c/o lower abd. Pain  States she is currently on her third day of cipro for UTI .

## 2016-10-06 NOTE — ED Provider Notes (Signed)
[facility]-EMERGENCY DEPT Provider Note   CSN: [id number] Arrival date & time: 10/06/16  1027   By signing my name below, I, Bobbie Stack, attest that this documentation has been prepared under the direction and in the presence of Michela Pitcher, [place]. Electronically Signed: Bobbie Stack, Scribe. 10/06/16. 12:15 PM. History   Chief Complaint Chief Complaint  Patient presents with  . Flank Pain  . Abdominal Pain    The history is provided by the patient. No language interpreter was used.  HPI Comments: Brenda Garcia is a 36 y.o. female who presents to the Emergency Department complaining of worsening cramping/achy, left sided flank pain for the past 2 days. She rates the pain 8/10. She states that the pain radiates to her LLQ. She had tried taking aleve with no relief. She also reports urgency, frequency, and a feeling of "fullness" that began at that time. The patient states that she was seen  by [facility] urgent care 2 days ago and was diagnosed with a UTI. She was discharged and prescribed a 5 day course of Cipro for which she is on day 3. The patient reports vomiting x 3 which began today. She denies muscle aches, SOB, CP, diarrhea, constipation, coughing, wheezing, sneezing, rhinorrhea, vaginal itching, and vaginal discharge. She has had her gall bladder removed. LMP was 6 years ago (pt has Mirena).  History reviewed. No pertinent past medical history.  There are no active problems to display for this patient.   Past Surgical History:  Procedure Laterality Date  . CHOLECYSTECTOMY  04/21/2012   Procedure: LAPAROSCOPIC CHOLECYSTECTOMY;  Surgeon: Romie Levee, MD;  Location: [facility] OR;  Service: General;  Laterality: N/A;    OB History    Gravida Para Term Preterm AB Living   1 1   1        SAB TAB Ectopic Multiple Live Births                   Home Medications    Prior to Admission medications   Medication Sig Start Date End Date Taking? Authorizing Provider    HYDROcodone-acetaminophen (NORCO/VICODIN) 5-325 MG tablet Take 1 tablet by mouth every 6 (six) hours as needed. 10/06/16 10/08/16  Jeanie Sewer, PA  ibuprofen (ADVIL,MOTRIN) 800 MG tablet Take 1 tablet (800 mg total) by mouth every 6 (six) hours as needed. 10/06/16 10/11/16  Jeanie Sewer, PA  tamsulosin (FLOMAX) 0.4 MG CAPS capsule Take 1 capsule (0.4 mg total) by mouth daily after supper. 10/06/16 10/13/16  Jeanie Sewer, PA  valACYclovir (VALTREX) 500 MG tablet Take 1 tablet (500 mg total) by mouth 2 (two) times daily. For 3 days as needed. 02/12/14   Antionette Char, MD    Family History History reviewed. No pertinent family history.  Social History Social History  Substance Use Topics  . Smoking status: Current Every Day Smoker  . Smokeless tobacco: Never Used  . Alcohol use Yes     Allergies   Eggs or egg-derived products and Iohexol   Review of Systems Review of Systems  Constitutional: Negative for chills and fever.  HENT: Negative for congestion, rhinorrhea and sneezing.   Respiratory: Negative for cough, shortness of breath and wheezing.   Cardiovascular: Negative for chest pain.  Gastrointestinal: Positive for abdominal pain, nausea and vomiting. Negative for constipation and diarrhea.  Genitourinary: Positive for flank pain, frequency and urgency. Negative for hematuria, vaginal bleeding and vaginal discharge.  Musculoskeletal: Negative for back pain.  Neurological: Negative for headaches.  Physical Exam Updated Vital Signs BP 107/60 (BP Location: Right Arm)   Pulse (!) 50   Temp 98.2 F (36.8 C) (Oral)   Resp 16   Ht 5\' 10"  (1.778 m)   Wt 97.5 kg   SpO2 99%   BMI 30.85 kg/m   Physical Exam  Constitutional: She is oriented to person, place, and time. She appears well-developed and well-nourished. No distress.  Moving frequently, trying to get comfortable  HENT:  Head: Normocephalic and atraumatic.  Mouth/Throat: Oropharynx is clear and moist.  Eyes:  Conjunctivae and EOM are normal. Right eye exhibits no discharge. Left eye exhibits no discharge. No scleral icterus.  Neck: Normal range of motion. Neck supple. No JVD present. No tracheal deviation present. No thyromegaly present.  Cardiovascular: Normal rate and regular rhythm.   Pulmonary/Chest: Effort normal and breath sounds normal. No respiratory distress.  Abdominal: Soft. She exhibits no distension. There is tenderness. There is no guarding.  LUQ and LLQ ttp, no tenderness at McBurney's point, negative Psoas sign, negative Rovsing's, negative Murphy's, no distention or mass, mild suprapubic tenderness  Genitourinary:  Genitourinary Comments: Left CVA tenderness  Musculoskeletal: Normal range of motion. She exhibits no tenderness.  No ttp of midline spine  Neurological: She is alert and oriented to person, place, and time.  Skin: Skin is warm and dry. Capillary refill takes less than 2 seconds. No rash noted. She is not diaphoretic. No erythema.  Psychiatric: She has a normal mood and affect. Her behavior is normal. Judgment and thought content normal.     ED Treatments / Results  DIAGNOSTIC STUDIES: Oxygen Saturation is 98% on RA, normal by my interpretation.  CT renal stone shows 1-532mm distal left ureteral calculus proximal to the UVJ causing mild hydronephrosis.      COORDINATION OF CARE: 12:07 PM Discussed treatment plan with pt at bedside and pt agreed to plan. I will check the patient's CT scan and labs.  2:06 PM Reassessed. I discussed the CT scan results with the patient which shows 1-2 mm kidney stone on the left. I will discharge her with Flomax and pain meds.   Labs (all labs ordered are listed, but only abnormal results are displayed) Labs Reviewed  COMPREHENSIVE METABOLIC PANEL - Abnormal; Notable for the following:       Result Value   CO2 21 (*)    Glucose, Bld 165 (*)    BUN 5 (*)    Total Bilirubin 1.3 (*)    All other components within normal limits    URINALYSIS, ROUTINE W REFLEX MICROSCOPIC - Abnormal; Notable for the following:    APPearance TURBID (*)    Specific Gravity, Urine >1.030 (*)    Hgb urine dipstick SMALL (*)    Ketones, ur 15 (*)    Protein, ur 100 (*)    Leukocytes, UA TRACE (*)    All other components within normal limits  URINALYSIS, MICROSCOPIC (REFLEX) - Abnormal; Notable for the following:    Bacteria, UA MANY (*)    Squamous Epithelial / LPF 6-30 (*)    All other components within normal limits  LIPASE, BLOOD  CBC  I-STAT BETA HCG BLOOD, ED ([facility], [facility], [facility] ONLY)    EKG  EKG Interpretation None       Radiology Ct Renal Stone Study  Result Date: 10/06/2016 CLINICAL DATA:  Left flank pain.  Nausea foot 2 days. EXAM: CT ABDOMEN AND PELVIS WITHOUT CONTRAST TECHNIQUE: Multidetector CT imaging of the abdomen and pelvis was performed  following the standard protocol without IV contrast. COMPARISON:  None. FINDINGS: Lower chest: No acute abnormality. Hepatobiliary: No focal liver abnormality is seen. Status post cholecystectomy. No biliary dilatation. Pancreas: Unremarkable. No pancreatic ductal dilatation or surrounding inflammatory changes. Spleen: Normal in size without focal abnormality. Adrenals/Urinary Tract: Adrenal glands are unremarkable. Normal right kidney. 1-2 mm distal left ureteral calculus just proximal to the UVJ resulting in moderate hydronephrosis. Bladder is unremarkable. Stomach/Bowel: Stomach is within normal limits. Appendix appears normal. No evidence of bowel wall thickening, distention, or inflammatory changes. Vascular/Lymphatic: No significant vascular findings are present. No enlarged abdominal or pelvic lymph nodes. Reproductive: Uterus and bilateral adnexa are unremarkable. Intrauterine device in satisfactory position. Other: No abdominal wall hernia or abnormality. No abdominopelvic ascites. Musculoskeletal: No acute osseous abnormality. No aggressive lytic or sclerotic osseous lesion. Sclerosis  involving the posterior ilium bilaterally adjacent to the SI joints as can be seen with osteitis condensans ilii. IMPRESSION: 1. 1-2 mm distal left ureteral calculus just proximal to the UVJ resulting in moderate hydronephrosis. 2. Bilateral osteitis condensans ilii. Electronically Signed   By: Elige Ko   On: 10/06/2016 13:47    Procedures Procedures (including critical care time)  Medications Ordered in ED Medications  ondansetron (ZOFRAN-ODT) disintegrating tablet 4 mg (not administered)  ondansetron (ZOFRAN-ODT) 4 MG disintegrating tablet (not administered)  oxyCODONE-acetaminophen (PERCOCET/ROXICET) 5-325 MG per tablet 1 tablet (not administered)  oxyCODONE-acetaminophen (PERCOCET/ROXICET) 5-325 MG per tablet (1 tablet  Given 10/06/16 1111)  ketorolac (TORADOL) injection 60 mg (60 mg Intramuscular Given 10/06/16 1216)     Initial Impression / Assessment and Plan / ED Course  I have reviewed the triage vital signs and the nursing notes.  Pertinent labs & imaging results that were available during my care of the patient were reviewed by me and considered in my medical decision making (see chart for details).  Clinical Course as of Oct 06 1448  Tue Oct 06, 2016  1404 CT Renal [facility] [place] [CR]    Clinical Course User Index [CR] Bobbie Stack    89 presents to ED with 3 day history frequency, urgency, and new onset left flank pain yesterday. Seen in urgent care 3 days ago, started on Cipro for UTI. Pt afebrile, VSS. Pain appears to be colicky, pt moving frequently to try to get comfortable. Given toradol, percocet, for pain, zofran for nausea. CBC and CMP unremarkable. UA consistent with nephrolithiasis with microscopic hematuria and trace leukocytes. CT renal study shows 1-68mm stone just proximal to UVJ, moderate hydronephrosis, appendix and bowel appear normal. Low suspicion appendicitis, pyelonephritis, colitis in absence of fever. On re-evaluation, pt states toradol was  helpful and nausea has improved. Stone <91mm will likely pass spontaneously without intervention; discussed hydration, pain control, and follow up with PCP within 2-3 days. Discharged with flomax, ibuprofen, and small amount of norco. Provided work note for today and tomorrow. Discussed strict ED return precautions. Pt understands and is agreeable to this plan.   Final Clinical Impressions(s) / ED Diagnoses   Final diagnoses:  Left nephrolithiasis    New Prescriptions Discharge Medication List as of 10/06/2016  2:14 PM    START taking these medications   Details  HYDROcodone-acetaminophen (NORCO/VICODIN) 5-325 MG tablet Take 1 tablet by mouth every 6 (six) hours as needed., Starting Tue 10/06/2016, Until Thu 10/08/2016, Print    ibuprofen (ADVIL,MOTRIN) 800 MG tablet Take 1 tablet (800 mg total) by mouth every 6 (six) hours as needed., Starting Tue 10/06/2016, Until Sun 10/11/2016, Print  tamsulosin (FLOMAX) 0.4 MG CAPS capsule Take 1 capsule (0.4 mg total) by mouth daily after supper., Starting Tue 10/06/2016, Until Tue 10/13/2016, Print       I personally performed the services described in this documentation, which was scribed in my presence. The recorded information has been reviewed and is accurate.    Jeanie Sewer, [place] 10/06/16 1517    Canary Brim Tegeler, MD 10/07/16 [phone]

## 2018-09-30 IMAGING — CT CT RENAL STONE PROTOCOL
2 of 4 series · 16 of 46 positions shown, 18 images · non-contrast
Comparison: None.

CLINICAL DATA: Left flank pain.  Nausea foot 2 days.

EXAM:
CT ABDOMEN AND PELVIS WITHOUT CONTRAST
TECHNIQUE: Multidetector CT imaging of the abdomen and pelvis was performed
following the standard protocol without IV contrast.

[Series 3: renal stone 5.0 · axial · 0.77mm/px · z∈[-1007,-572]mm · 13 of 95 slices shown, 15 images]
[im 4/95  soft-tissue]
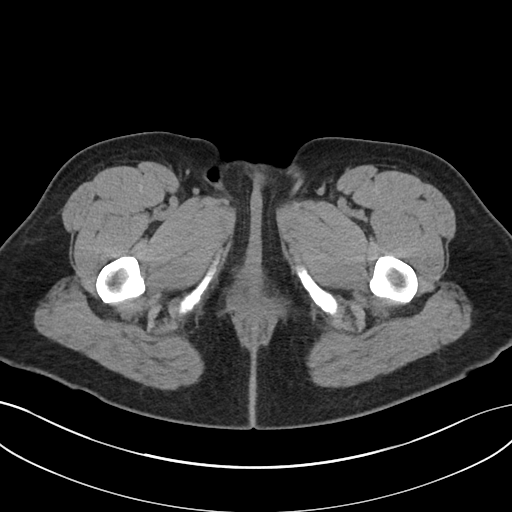
[im 4/95  bone]
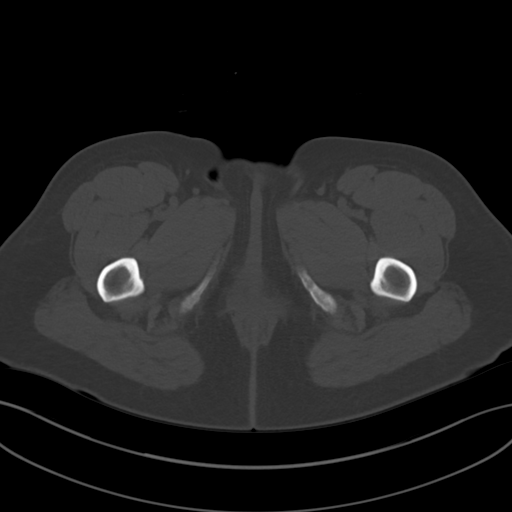
[im 12/95  soft-tissue]
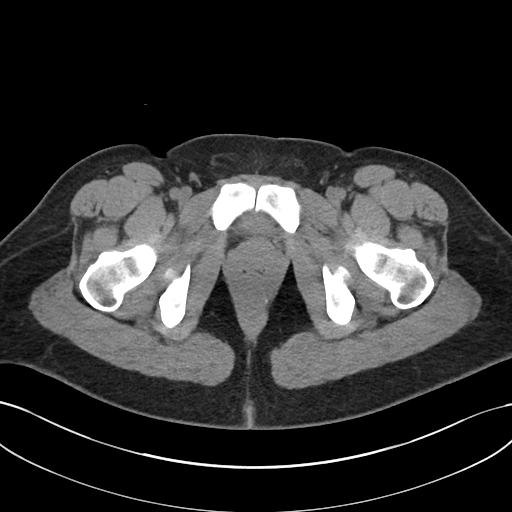
[im 20/95  soft-tissue]
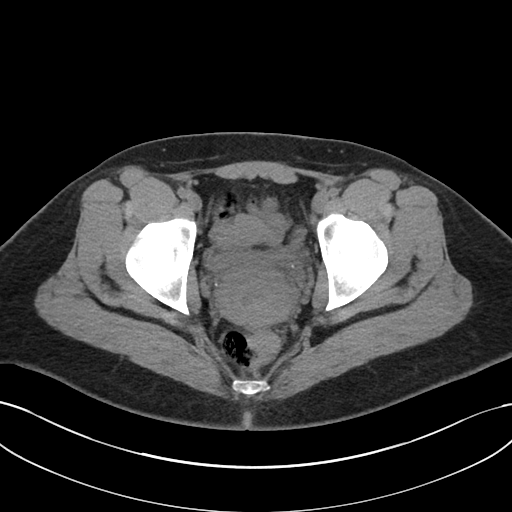
[im 28/95  soft-tissue]
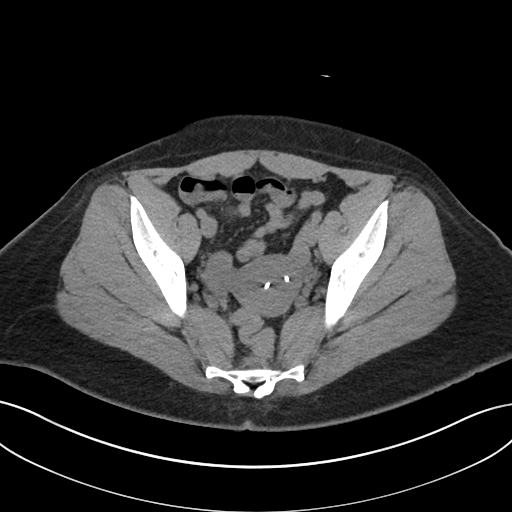
[im 32/95  soft-tissue]
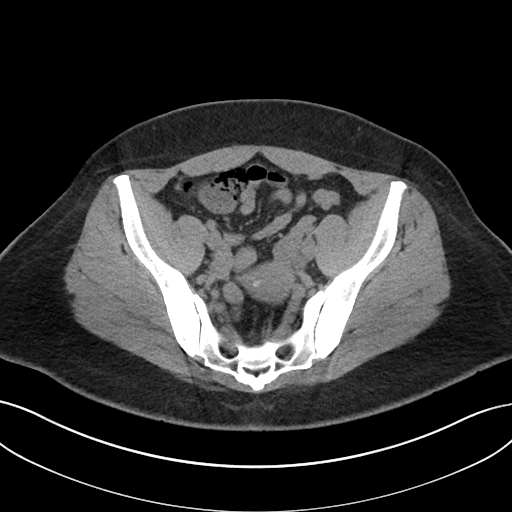
[im 40/95  soft-tissue]
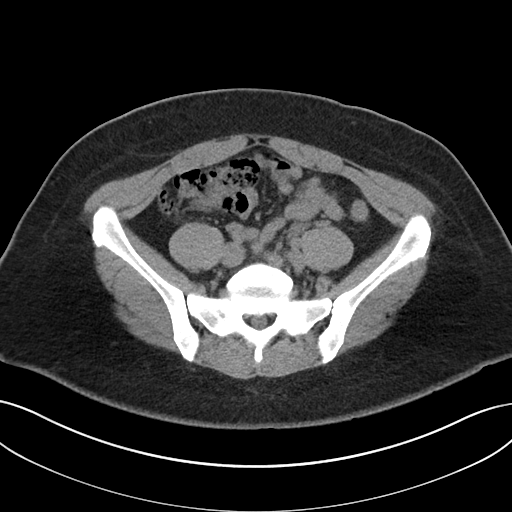
[im 48/95  soft-tissue]
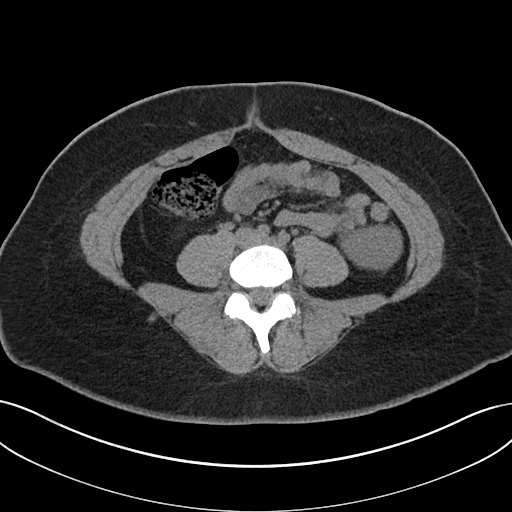
[im 55/95  soft-tissue]
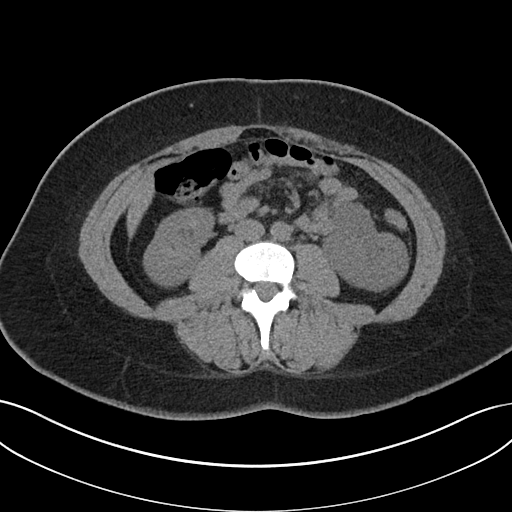
[im 63/95  soft-tissue]
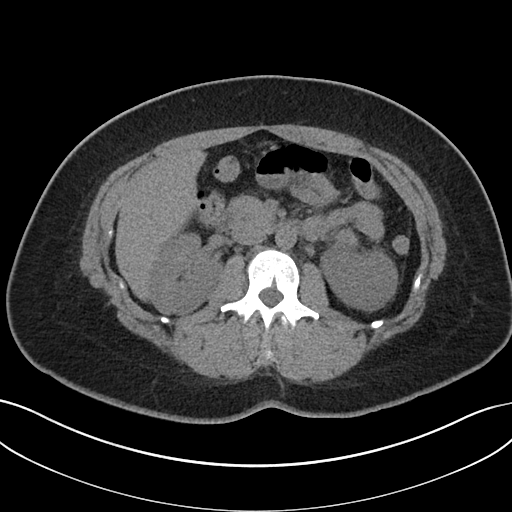
[im 63/95  bone]
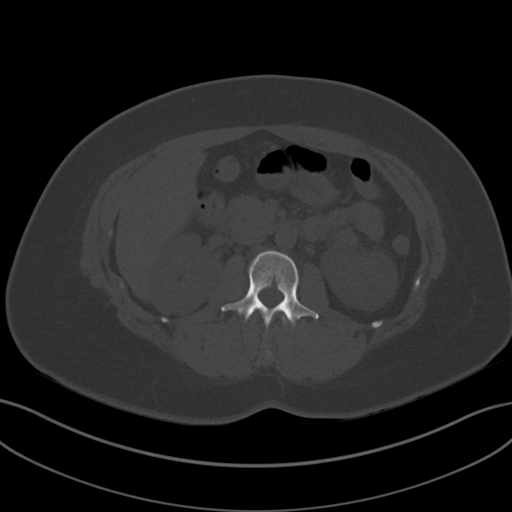
[im 67/95  soft-tissue]
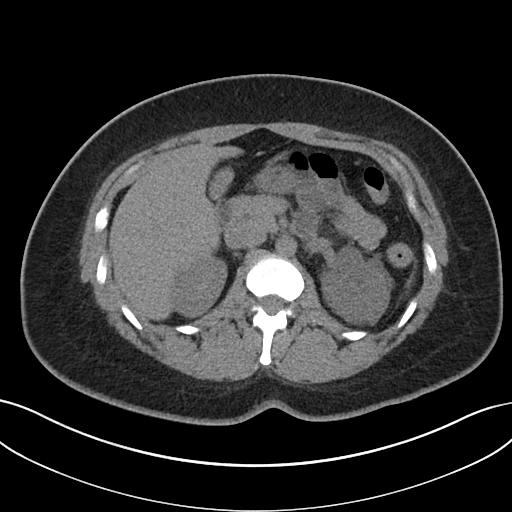
[im 75/95  soft-tissue]
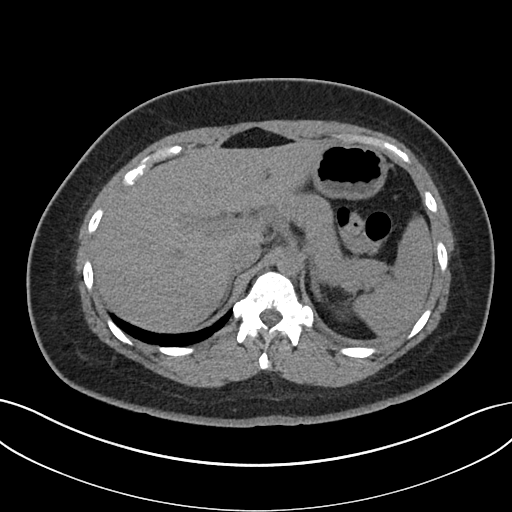
[im 83/95  soft-tissue]
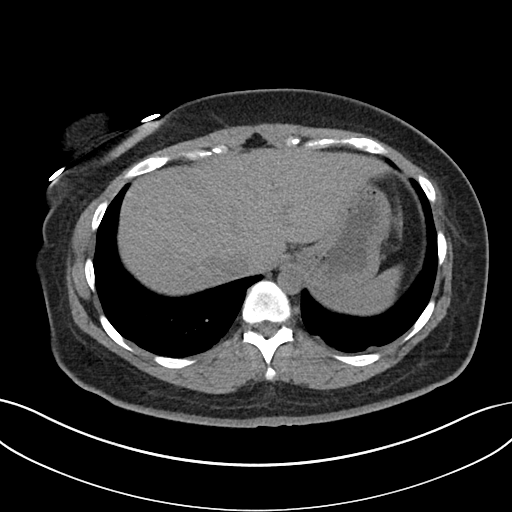
[im 91/95  soft-tissue]
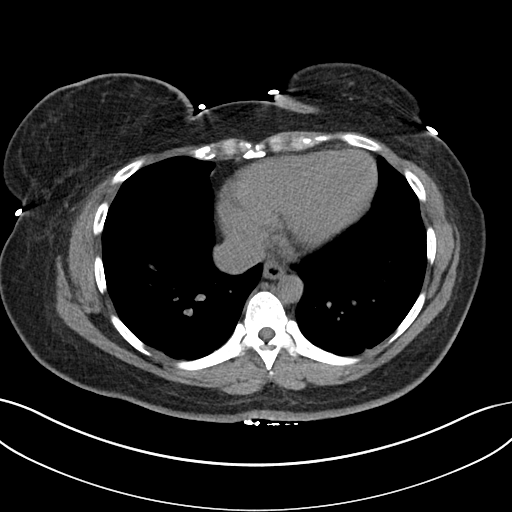

[Series 5: renal stone 3.0 cor · coronal · 0.76mm/px · 3 of 76 slices shown]
[im 26/76  soft-tissue]
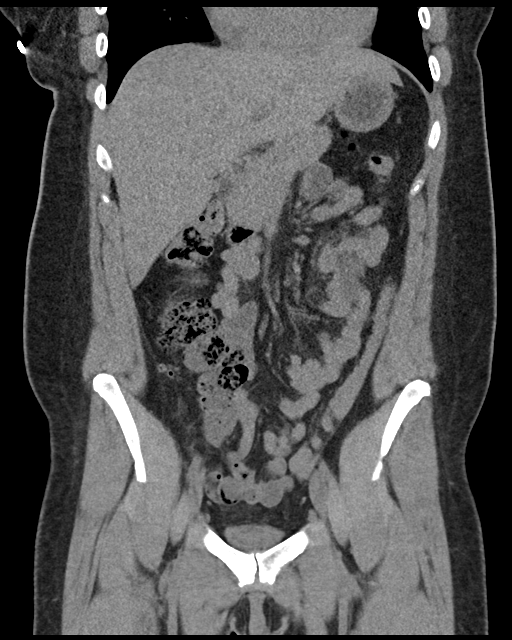
[im 34/76  soft-tissue]
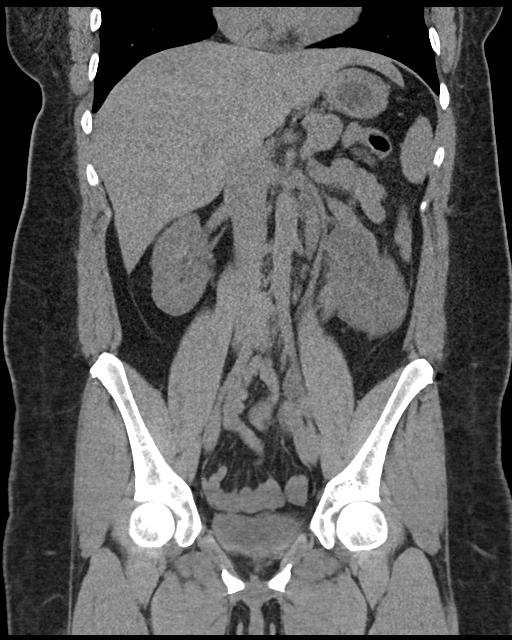
[im 42/76  soft-tissue]
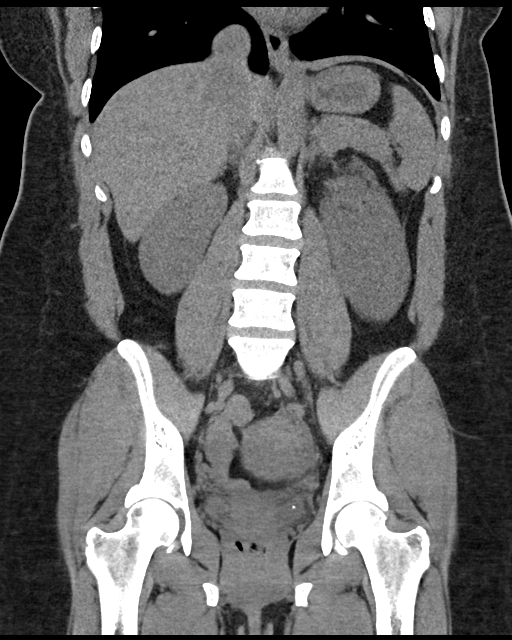

[16 of 46 positions shown; findings below may reference images not displayed]

FINDINGS: Lower chest: No acute abnormality.

Hepatobiliary: No focal liver abnormality is seen. Status post
cholecystectomy. No biliary dilatation.

Pancreas: Unremarkable. No pancreatic ductal dilatation or
surrounding inflammatory changes.

Spleen: Normal in size without focal abnormality.

Adrenals/Urinary Tract: Adrenal glands are unremarkable. Normal
right kidney. 1-2 mm distal left ureteral calculus just proximal to
the UVJ resulting in moderate hydronephrosis. Bladder is
unremarkable.

Stomach/Bowel: Stomach is within normal limits. Appendix appears
normal. No evidence of bowel wall thickening, distention, or
inflammatory changes.

Vascular/Lymphatic: No significant vascular findings are present. No
enlarged abdominal or pelvic lymph nodes.

Reproductive: Uterus and bilateral adnexa are unremarkable.
Intrauterine device in satisfactory position.

Other: No abdominal wall hernia or abnormality. No abdominopelvic
ascites.

Musculoskeletal: No acute osseous abnormality. No aggressive lytic
or sclerotic osseous lesion. Sclerosis involving the posterior ilium
bilaterally adjacent to the SI joints as can be seen with osteitis
condensans ilii.
IMPRESSION: 1. 1-2 mm distal left ureteral calculus just proximal to the UVJ
resulting in moderate hydronephrosis.
2. Bilateral osteitis condensans ilii.

## 2022-11-25 ENCOUNTER — Encounter: Payer: Self-pay | Admitting: Internal Medicine

## 2022-11-25 ENCOUNTER — Other Ambulatory Visit: Payer: Self-pay

## 2022-11-25 ENCOUNTER — Ambulatory Visit (INDEPENDENT_AMBULATORY_CARE_PROVIDER_SITE_OTHER): Payer: Medicaid Other | Admitting: Internal Medicine

## 2022-11-25 VITALS — BP 124/70 | HR 62 | Temp 97.7°F | Resp 16 | Ht 68.0 in | Wt 237.7 lb

## 2022-11-25 DIAGNOSIS — J343 Hypertrophy of nasal turbinates: Secondary | ICD-10-CM

## 2022-11-25 DIAGNOSIS — Z91012 Allergy to eggs: Secondary | ICD-10-CM

## 2022-11-25 DIAGNOSIS — J3081 Allergic rhinitis due to animal (cat) (dog) hair and dander: Secondary | ICD-10-CM

## 2022-11-25 DIAGNOSIS — J301 Allergic rhinitis due to pollen: Secondary | ICD-10-CM

## 2022-11-25 DIAGNOSIS — J3089 Other allergic rhinitis: Secondary | ICD-10-CM

## 2022-11-25 DIAGNOSIS — H1013 Acute atopic conjunctivitis, bilateral: Secondary | ICD-10-CM

## 2022-11-25 MED ORDER — TRIAMCINOLONE ACETONIDE 55 MCG/ACT NA AERO: 2.0000 | INHALATION_SPRAY | Freq: Every day | NASAL | 5 refills | Status: AC

## 2022-11-25 MED ORDER — OLOPATADINE HCL 0.2 % OP SOLN: 1.0000 [drp] | Freq: Every day | OPHTHALMIC | 5 refills | Status: AC | PRN

## 2022-11-25 MED ORDER — AZELASTINE HCL 0.1 % NA SOLN: 1.0000 | Freq: Two times a day (BID) | NASAL | 5 refills | Status: AC | PRN

## 2022-11-25 MED ORDER — LEVOCETIRIZINE DIHYDROCHLORIDE 5 MG PO TABS: 5.0000 mg | ORAL_TABLET | Freq: Every evening | ORAL | 5 refills | Status: AC

## 2022-11-25 NOTE — Progress Notes (Signed)
NEW PATIENT  Date of Service/Encounter:  11/25/22  Consult requested by: Patient, No Pcp Per   Subjective:   Brenda Garcia (DOB: 1981/04/22) is a 42 y.o. female who presents to the clinic on 11/25/2022 with a chief complaint of Establish Care, Allergy Testing (Congested year round), and Sinus Problem .    History obtained from: chart review and patient.   Rhinitis:  Started around teenager.  Symptoms include: nasal congestion, rhinorrhea, post nasal drainage, sneezing, watery eyes, and itchy eyes  Occurs year-round but flares up in Spring/Summer  Potential triggers: not sure  Treatments tried:  Zyrtec/Claritin daily; last use was over 3 days ago Allegra D sometimes Saline spray Flonase PRN; irritates her and causes more sneezing   Previous allergy testing: yes around age 74- positive ragweed and dust mite, also had positive foods but reports outgrowing it.   History of reflux/heartburn: no History of sinus surgery: no Nonallergic triggers: none   History of Food allergies: Had an egg allergy in childhood but now eating eggs almost daily, no issues.    Past Medical History: No past medical history on file.  Past Surgical History: Past Surgical History:  Procedure Laterality Date   CHOLECYSTECTOMY  04/21/2012   Procedure: LAPAROSCOPIC CHOLECYSTECTOMY;  Surgeon: Romie Levee, MD;  Location: [facility] OR;  Service: General;  Laterality: N/A;    Family History: No family history on file.  Social History:  Lives in a 20 year house Flooring in bedroom: laminate Pets: dog Tobacco use/exposure: none Job: customer service  Medication List:  Allergies as of 11/25/2022       Reactions   Iohexol     Code: VOM, Desc: severe vomiting tightness in face swollen face and eyes  fluids run no meds given pt observed 30 min and released with reation card  ms 03/06/2009        Medication List        Accurate as of Nov 25, 2022  3:47 PM. If you have any questions, ask your  nurse or doctor.          Allegra Allergy 60 MG tablet Generic drug: fexofenadine Take 60 mg by mouth 2 (two) times daily.   cetirizine 10 MG tablet Commonly known as: ZYRTEC Take 10 mg by mouth daily.   fluticasone 50 MCG/ACT nasal spray Commonly known as: FLONASE Place into both nostrils daily.   loratadine 10 MG tablet Commonly known as: CLARITIN Take 10 mg by mouth daily.   valACYclovir 500 MG tablet Commonly known as: Valtrex Take 1 tablet (500 mg total) by mouth 2 (two) times daily. For 3 days as needed.         REVIEW OF SYSTEMS: Pertinent positives and negatives discussed in HPI.   Objective:   Physical Exam: BP 124/70 (BP Location: Left Arm, Patient Position: Sitting, Cuff Size: Normal)   Pulse 62   Temp 97.7 F (36.5 C) (Temporal)   Resp 16   Ht 5\' 8"  (1.727 m)   Wt 237 lb 11.2 oz (107.8 kg)   SpO2 98%   BMI 36.14 kg/m  Body mass index is 36.14 kg/m. GEN: alert, well developed HEENT: clear conjunctiva, TM grey and translucent, nose with + inferior turbinate hypertrophy, boggy nasal mucosa, slight clear rhinorrhea, + cobblestoning HEART: regular rate and rhythm, no murmur LUNGS: clear to auscultation bilaterally, no coughing, unlabored respiration ABDOMEN: soft, non distended  SKIN: no rashes or lesions  Reviewed:  08/25/2021: underwent LEEP for cervical dysplasia, high grade, CIN2.  10/06/2016: seen in the ED for L sided flank pain, urinary frequency/urgency/fullness. Tried Cipro for UTI. UA and CT renal showed nephrolithiasis, <68mm.  Symptomatic care at home and PCP follow up.   04/20/2012: admitted for cholelithiasis and biliary colic.  Underwent cholecystectomy, did well.   Skin Testing:  Skin prick testing was placed, which includes aeroallergens/foods, histamine control, and saline control.  Verbal consent was obtained prior to placing test.  Patient tolerated procedure well.  Allergy testing results were read and interpreted by myself,  documented by clinical staff. Adequate positive and negative control.  Results discussed with patient/family.  Airborne Adult Perc - 11/25/22 1436     Time Antigen Placed 1436    Location Back    Number of Test 59    1. Control-Buffer 50% Glycerol Negative    2. Control-Histamine 1 mg/ml 3+    3. Albumin saline Negative    4. Bahia 3+    5. [place] 3+    6. Johnson 3+    7. Kentucky Blue 4+    8. Meadow Fescue 4+    9. Perennial Rye 4+    10. Sweet Vernal 4+    11. Timothy 4+    12. Cocklebur 3+    13. Burweed Marshelder 3+    14. Ragweed, short 3+    15. Ragweed, Giant 3+    16. Plantain,  English 2+    17. Lamb's Quarters 2+    18. Sheep Sorrell 2+    19. Rough Pigweed Negative    20. Marsh Elder, Rough Negative    21. Mugwort, Common 3+    22. Ash mix Negative    23. Birch mix Negative    24. Beech American 3+    25. Box, Elder Negative    26. Cedar, red 2+    27. Cottonwood, Guinea-Bissau Negative    28. Elm mix 3+    29. Hickory 4+    30. Maple mix Negative    31. Oak, Guinea[place]au mix 3+    32. Pecan Pollen 3+    33. Pine mix 2+    34. Sycamore Eastern 2+    35. Walnut, Black Pollen 3+    36. Alternaria alternata Negative    37. Cladosporium Herbarum Negative    38. Aspergillus mix Negative    39. Penicillium mix Negative    40. Bipolaris sorokiniana (Helminthosporium) Negative    41. Drechslera spicifera (Curvularia) Negative    42. Mucor plumbeus Negative    43. Fusarium moniliforme Negative    44. Aureobasidium pullulans (pullulara) Negative    45. Rhizopus oryzae Negative    46. Botrytis cinera Negative    47. Epicoccum nigrum Negative    48. Phoma betae 3+    49. Candida Albicans 2+    50. Trichophyton mentagrophytes Negative    51. Mite, D Farinae  5,000 AU/ml 3+    52. Mite, D Pteronyssinus  5,000 AU/ml 3+    53. Cat Hair 10,000 BAU/ml Negative    54.  Dog Epithelia Negative    55. Mixed Feathers 2+    56. Horse Epithelia Negative    57. Cockroach,  German Negative    58. Mouse Negative    59. Tobacco Leaf Negative             Intradermal - 11/25/22 1517     Time Antigen Placed 1517    Allergen Manufacturer Waynette Buttery    Location Arm    Number of Test 8  [place] Omitted    Johnson Omitted    7 Grass Omitted    Ragweed mix Omitted    Weed mix Omitted    Tree mix Omitted    Mold 1 2+    Mold 2 2+    Mold 3 Negative    Mold 4 2+    Cat 3+    Dog 3+    Cockroach Negative    Mite mix Omitted    Other Omitted               Assessment:   1. Nasal turbinate hypertrophy   2. Non-seasonal allergic rhinitis due to pollen   3. Allergic conjunctivitis of both eyes   4. History of allergy to eggs   5. Allergic rhinitis caused by mold   6. Allergic rhinitis due to animal hair or dander   7. Allergic rhinitis due to dust mite     Plan/Recommendations:   Allergic Rhinoconjunctivitis  - Due to turbinate hypertrophy, seasonal symptoms and unresponsive to OTC meds, performed skin testing to identify aeroallergen triggers.   - Positive skin test 11/2022: trees, grasses, weeds, mold, cat, dog, dust mite, feathers,  - Avoidance measures discussed. - Use nasal saline rinses before nose sprays such as with Neilmed Sinus Rinse.  Use distilled water.   - Use Nasacort or Nasonex or Flonase Sensimist 2 sprays each nostril daily. Aim upward and outward. - Use Azelastine 1-2 sprays each nostril twice daily as needed. Aim upward and outward. - Use Xyzal 5mg  daily.  - For eyes, use Olopatadine or Ketotifen 1 eye drop daily as needed for itchy, watery eyes.  Available over the counter, if not covered by insurance.  - Consider allergy shots as long term control of your symptoms by teaching your immune system to be more tolerant of your allergy triggers   History of Egg Allergy - Now eating eggs without any issues. You do not have an egg allergy, will remove this from your allergy list.     Return in about 6 weeks (around  01/06/2023).  Alesia Morin, MD [facility]

## 2022-11-25 NOTE — Patient Instructions (Addendum)
Brenda Garcia Return in about 6 weeks (around 01/06/2023).   Allergic Rhinitis: - Positive skin test 11/2022: trees, grasses, weeds, mold, cat, dog, dust mite, feathers,  - Avoidance measures discussed. - Use nasal saline rinses before nose sprays such as with Neilmed Sinus Rinse.  Use distilled water.   - Use Nasacort or Nasonex or Flonase Sensimist 2 sprays each nostril daily. Aim upward and outward. - Use Azelastine 1-2 sprays each nostril twice daily as needed. Aim upward and outward. - Use Xyzal 5mg  daily or Zyrtec 10mg  daily or Allegra 180mg  daily.  - For eyes, use Olopatadine or Ketotifen 1 eye drop daily as needed for itchy, watery eyes.  Available over the counter, if not covered by insurance.  - Consider allergy shots as long term control of your symptoms by teaching your immune system to be more tolerant of your allergy triggers   History of Egg Allergy - Now eating eggs without any issues. You do not have an egg allergy, will remove this from your allergy list.    ALLERGEN AVOIDANCE MEASURES   Dust Mites Use central air conditioning and heat; and change the filter monthly.  Pleated filters work better than mesh filters.  Electrostatic filters may also be used; wash the filter monthly.  Window air conditioners may be used, but do not clean the air as well as a central air conditioner.  Change or wash the filter monthly. Keep windows closed.  Do not use attic fans.   Encase the mattress, box springs and pillows with zippered, dust proof covers. Wash the bed linens in hot water weekly.   Remove carpet, especially from the bedroom. Remove stuffed animals, throw pillows, dust ruffles, heavy drapes and other items that collect dust from the bedroom. Do not use a humidifier.   Use wood, vinyl or leather furniture instead of cloth furniture in the bedroom. Keep the indoor humidity at 30 - 40%.  Monitor with a humidity gauge.  Molds - Indoor avoidance Use air conditioning to  reduce indoor humidity.  Do not use a humidifier. Keep indoor humidity at 30 - 40%.  Use a dehumidifier if needed. In the bathroom use an exhaust fan or open a window after showering.  Wipe down damp surfaces after showering.  Clean bathrooms with a mold-killing solution (diluted bleach, or products like Tilex, etc) at least once a month. In the kitchen use an exhaust fan to remove steam from cooking.  Throw away spoiled foods immediately, and empty garbage daily.  Empty water pans below self-defrosting refrigerators frequently. Vent the clothes dryer to the outside. Limit indoor houseplants; mold grows in the dirt.  No houseplants in the bedroom. Remove carpet from the bedroom. Encase the mattress and box springs with a zippered encasing.  Molds - Outdoor avoidance Avoid being outside when the grass is being mowed, or the ground is tilled. Avoid playing in leaves, pine straw, hay, etc.  Dead plant materials contain mold. Avoid going into barns or grain storage areas. Remove leaves, clippings and compost from around the home. Pollen Avoidance Pollen levels are highest during the mid-day and afternoon.  Consider this when planning outdoor activities. Avoid being outside when the grass is being mowed, or wear a mask if the pollen-allergic person must be the one to mow the grass. Keep the windows closed to keep pollen outside of the home. Use an air conditioner to filter the air. Take a shower, wash hair, and change clothing after working or playing outdoors during pollen  season. Pet Dander Keep the pet out of your bedroom and restrict it to only a few rooms. Be advised that keeping the pet in only one room will not limit the allergens to that room. Don't pet, hug or kiss the pet; if you do, wash your hands with soap and water. High-efficiency particulate air (HEPA) cleaners run continuously in a bedroom or living room can reduce allergen levels over time. Regular use of a high-efficiency vacuum  cleaner or a central vacuum can reduce allergen levels. Giving your pet a bath at least once a week can reduce airborne allergen.

## 2022-12-30 ENCOUNTER — Ambulatory Visit: Payer: Medicaid Other | Admitting: Internal Medicine

## 2023-04-05 ENCOUNTER — Ambulatory Visit (INDEPENDENT_AMBULATORY_CARE_PROVIDER_SITE_OTHER): Payer: Medicaid Other | Admitting: Internal Medicine

## 2023-04-05 VITALS — BP 122/78 | HR 62 | Temp 98.1°F | Resp 16 | Wt 239.6 lb

## 2023-04-05 DIAGNOSIS — J301 Allergic rhinitis due to pollen: Secondary | ICD-10-CM

## 2023-04-05 DIAGNOSIS — J343 Hypertrophy of nasal turbinates: Secondary | ICD-10-CM | POA: Diagnosis not present

## 2023-04-05 DIAGNOSIS — J3089 Other allergic rhinitis: Secondary | ICD-10-CM | POA: Diagnosis not present

## 2023-04-05 DIAGNOSIS — J3489 Other specified disorders of nose and nasal sinuses: Secondary | ICD-10-CM

## 2023-04-05 DIAGNOSIS — J3081 Allergic rhinitis due to animal (cat) (dog) hair and dander: Secondary | ICD-10-CM

## 2023-04-05 MED ORDER — IPRATROPIUM BROMIDE 0.06 % NA SOLN: 2.0000 | Freq: Four times a day (QID) | NASAL | 12 refills | Status: AC

## 2023-04-05 MED ORDER — PREDNISONE 10 MG PO TABS: 10.0000 mg | ORAL_TABLET | Freq: Two times a day (BID) | ORAL | 0 refills | Status: AC

## 2023-04-05 MED ORDER — METHYLPREDNISOLONE ACETATE 40 MG/ML IJ SUSP
40.0000 mg | Freq: Once | INTRAMUSCULAR | Status: AC
  Administered 2023-04-05: 40 mg via INTRAMUSCULAR

## 2023-04-05 NOTE — Progress Notes (Unsigned)
Follow Up Note  RE: Brenda Garcia MRN: [id number] DOB: 19-May-1981 Date of Office Visit: 04/05/2023  Referring provider: No ref. provider found Primary care provider: Patient, No Pcp Per  Chief Complaint: Follow-up (Pt states she have been with nasal congestions about 1-2 weeks now and its seems to be getting worse. Nothing seems to be working.) and Nasal Congestion  History of Present Illness: I had the pleasure of seeing Brenda Garcia for a follow up visit at the [facility] on 04/05/2023. She is a 42 y.o. female, who is being followed for allergic rhinitis . Her previous allergy office visit was on 11/25/22 with  Dr. Allena Katz  . Today is a regular follow up visit.  History obtained from patient, chart review.  Pertinent History/Diagnostics:   - Allergic Rhinitis: year round, flares in spring/summer   - SPT environmental panel (11/2022): trees, grasses, weeds, mold, cat, dog, dust mite, feathers,   Today she reports:  Significant persistent nasal congestion  worsening over the past weeks.  Initially nasal saline rinse helped, but not lasting.   Feels like nasal sprays are irritating her nasal mucosa and she sneezes it out.  Does have persistent rhinorrhea Feels like symptoms are triggered by air conditioning and cold air.  They improve when she steps outside. Denies any sinus tenderness, pressure. Significant hyposmia and hypogeusia. She has been compliant with her Flonase, saline rinses, Xyzal, Astelin. She is open to starting allergy injections if it would help.   Assessment and Plan: Brenda Garcia is a 42 y.o. female with: Nasal obstruction  Nasal turbinate hypertrophy - Plan: Ambulatory referral to ENT  Non-seasonal allergic rhinitis due to pollen  Allergic rhinitis caused by mold  Allergic rhinitis due to animal hair or dander  Allergic rhinitis due to dust mite   Plan: Patient Instructions  Allergic Rhinitis with complete nasal obstruction and severe  nasal turbinate hypertrophy  - Depo 40mg  IM given today  - Start tomorrow: prednisone 10mg  twice daily for 5 days  - We will refer you to ENT for possible turbinate reduction given exam today and nonallergic triggers  - Avoid any nose spray with oxymetalozone as an ingredient (this can make nasal congestion worse)    - Avoidance measures: trees, grasses, weeds, mold, cat, dog, dust mite, feathers,  - Use nasal saline rinses before nose sprays such as with Neilmed Sinus Rinse.  Use distilled water.   - Use Nasacort or Nasonex or Flonase Sensimist 2 sprays each nostril daily. Aim upward and outward. - Use Azelastine 1-2 sprays each nostril twice daily as needed. Aim upward and outward. - Use -Atrovent (ipatopium) nasal spray 1-2 sprays in each nostril up to three times daily AS NEEDED for POST NASAL DRIP/RUNNY NOSE/DRAINAGE.  If you become too dry, use less often. - Use Xyzal 5mg  daily or Zyrtec 10mg  daily or Allegra 180mg  daily.  - For eyes, use Olopatadine or Ketotifen 1 eye drop daily as needed for itchy, watery eyes.  Available over the counter, if not covered by insurance.  - Consider allergy shots as long term control of your symptoms by teaching your immune system to be more tolerant of your allergy triggers   Follow up: after ENT evaluation   Thank you so much for letting me partake in your care today.  Don't hesitate to reach out if you have any additional concerns!  Brenda Luz, MD  [facility] [place] [place]    Meds ordered this encounter  Medications   ipratropium (ATROVENT) 0.06 % nasal spray    Sig: Place 2 sprays into both nostrils 4 (four) times daily.    Dispense:  15 mL    Refill:  12   predniSONE (DELTASONE) 10 MG tablet    Sig: Take 1 tablet (10 mg total) by mouth 2 (two) times daily with a meal for 5 days.    Dispense:  10 tablet    Refill:  0    Lab Orders  No laboratory test(s) ordered today   Diagnostics: None done   Medication  List:  Current Outpatient Medications  Medication Sig Dispense Refill   azelastine (ASTELIN) 0.1 % nasal spray Place 1 spray into both nostrils 2 (two) times daily as needed. Use in each nostril as directed 30 mL 5   cetirizine (ZYRTEC) 10 MG tablet Take 10 mg by mouth daily.     ipratropium (ATROVENT) 0.06 % nasal spray Place 2 sprays into both nostrils 4 (four) times daily. 15 mL 12   Olopatadine HCl 0.2 % SOLN Apply 1 drop to eye daily as needed (itchy watery eyes). 2.5 mL 5   predniSONE (DELTASONE) 10 MG tablet Take 1 tablet (10 mg total) by mouth 2 (two) times daily with a meal for 5 days. 10 tablet 0   triamcinolone (NASACORT) 55 MCG/ACT AERO nasal inhaler Place 2 sprays into the nose daily. 1 each 5   valACYclovir (VALTREX) 500 MG tablet Take 1 tablet (500 mg total) by mouth 2 (two) times daily. For 3 days as needed. 30 tablet prn   levocetirizine (XYZAL) 5 MG tablet Take 1 tablet (5 mg total) by mouth every evening. (Patient not taking: Reported on 04/05/2023) 30 tablet 5   No current facility-administered medications for this visit.   Allergies: Allergies  Allergen Reactions   Iohexol      Code: VOM, Desc: severe vomiting tightness in face swollen face and eyes  fluids run no meds given pt observed 30 min and released with reation card  ms 03/06/2009    I reviewed her past medical history, social history, family history, and environmental history and no significant changes have been reported from her previous visit.  ROS: All others negative except as noted per HPI.   Objective: BP 122/78   Pulse 62   Temp 98.1 F (36.7 C) (Temporal)   Resp 16   Wt 239 lb 9.6 oz (108.7 kg)   SpO2 99%   BMI 36.43 kg/m  Body mass index is 36.43 kg/m. General Appearance:  Alert, cooperative, no distress, appears stated age  Head:  Normocephalic, without obvious abnormality, atraumatic  Eyes:  Conjunctiva clear, EOM's intact  Nose: Nares normal,  complete nasal obstruction on left, 90%  obstruction on right, copious clear rhinorrhea, hypertrophic turbinates, no visible anterior polyps, and septum midline  Throat: Lips, tongue normal; teeth and gums normal, + cobblestoning  Neck: Supple, symmetrical  Lungs:   clear to auscultation bilaterally, Respirations unlabored, no coughing  Heart:  regular rate and rhythm and no murmur, Appears well perfused  Extremities: No edema  Skin: Skin color, texture, turgor normal, no rashes or lesions on visualized portions of skin  Neurologic: No gross deficits   Previous notes and tests were reviewed. The plan was reviewed with the patient/family, and all questions/concerned were addressed.  It was my pleasure to see Brenda Garcia today and participate in her care. Please feel free to contact me with any questions or concerns.  Sincerely,  Brenda Luz, MD  Allergy & Immunology  [facility] Office: [phone]

## 2023-04-05 NOTE — Patient Instructions (Addendum)
Allergic Rhinitis with complete nasal obstruction and severe nasal turbinate hypertrophy  - Depo 40mg  IM given today  - Start tomorrow: prednisone 10mg  twice daily for 5 days  - We will refer you to ENT for possible turbinate reduction given exam today and nonallergic triggers  - Avoid any nose spray with oxymetalozone as an ingredient (this can make nasal congestion worse)    - Avoidance measures: trees, grasses, weeds, mold, cat, dog, dust mite, feathers,  - Use nasal saline rinses before nose sprays such as with Neilmed Sinus Rinse.  Use distilled water.   - Use Nasacort or Nasonex or Flonase Sensimist 2 sprays each nostril daily. Aim upward and outward. - Use Azelastine 1-2 sprays each nostril twice daily as needed. Aim upward and outward. - Use -Atrovent (ipatopium) nasal spray 1-2 sprays in each nostril up to three times daily AS NEEDED for POST NASAL DRIP/RUNNY NOSE/DRAINAGE.  If you become too dry, use less often. - Use Xyzal 5mg  daily or Zyrtec 10mg  daily or Allegra 180mg  daily.  - For eyes, use Olopatadine or Ketotifen 1 eye drop daily as needed for itchy, watery eyes.  Available over the counter, if not covered by insurance.  - Consider allergy shots as long term control of your symptoms by teaching your immune system to be more tolerant of your allergy triggers   Follow up: after ENT evaluation   Thank you so much for letting me partake in your care today.  Don't hesitate to reach out if you have any additional concerns!  Ferol Luz, MD  [facility] [place], [place]
